# Patient Record
Sex: Female | Born: 1955 | Race: White | Hispanic: No | Marital: Married | Smoking: Former smoker
Health system: Southern US, Community
[De-identification: ages and names within clinical notes are randomized; demographics above are authoritative.]

## PROBLEM LIST (undated history)

## (undated) DIAGNOSIS — M797 Fibromyalgia: Secondary | ICD-10-CM

## (undated) DIAGNOSIS — I219 Acute myocardial infarction, unspecified: Secondary | ICD-10-CM

## (undated) DIAGNOSIS — F419 Anxiety disorder, unspecified: Secondary | ICD-10-CM

## (undated) DIAGNOSIS — K219 Gastro-esophageal reflux disease without esophagitis: Secondary | ICD-10-CM

## (undated) DIAGNOSIS — I2510 Atherosclerotic heart disease of native coronary artery without angina pectoris: Secondary | ICD-10-CM

## (undated) DIAGNOSIS — F329 Major depressive disorder, single episode, unspecified: Secondary | ICD-10-CM

## (undated) DIAGNOSIS — T8859XA Other complications of anesthesia, initial encounter: Secondary | ICD-10-CM

## (undated) DIAGNOSIS — E785 Hyperlipidemia, unspecified: Secondary | ICD-10-CM

## (undated) DIAGNOSIS — I499 Cardiac arrhythmia, unspecified: Secondary | ICD-10-CM

## (undated) DIAGNOSIS — F32A Depression, unspecified: Secondary | ICD-10-CM

## (undated) DIAGNOSIS — I10 Essential (primary) hypertension: Secondary | ICD-10-CM

## (undated) HISTORY — DX: Essential (primary) hypertension: I10

## (undated) HISTORY — PX: SPINE SURGERY: SHX786

## (undated) HISTORY — DX: Depression, unspecified: F32.A

## (undated) HISTORY — PX: CHOLECYSTECTOMY: SHX55

## (undated) HISTORY — PX: CORONARY STENT PLACEMENT: SHX1402

## (undated) HISTORY — DX: Major depressive disorder, single episode, unspecified: F32.9

## (undated) HISTORY — PX: ABDOMINAL HYSTERECTOMY: SHX81

## (undated) HISTORY — DX: Hyperlipidemia, unspecified: E78.5

---

## 1997-10-24 ENCOUNTER — Ambulatory Visit (HOSPITAL_COMMUNITY): Admission: RE | Admit: 1997-10-24 | Discharge: 1997-10-24 | Payer: Self-pay | Admitting: *Deleted

## 1997-12-03 ENCOUNTER — Other Ambulatory Visit: Admission: RE | Admit: 1997-12-03 | Discharge: 1997-12-03 | Payer: Self-pay | Admitting: Obstetrics & Gynecology

## 1999-02-19 ENCOUNTER — Encounter: Payer: Self-pay | Admitting: Obstetrics & Gynecology

## 1999-02-19 ENCOUNTER — Ambulatory Visit (HOSPITAL_COMMUNITY): Admission: RE | Admit: 1999-02-19 | Discharge: 1999-02-19 | Payer: Self-pay | Admitting: Obstetrics & Gynecology

## 1999-09-18 ENCOUNTER — Other Ambulatory Visit: Admission: RE | Admit: 1999-09-18 | Discharge: 1999-09-18 | Payer: Self-pay | Admitting: Obstetrics & Gynecology

## 1999-09-24 ENCOUNTER — Encounter: Payer: Self-pay | Admitting: Obstetrics & Gynecology

## 1999-09-24 ENCOUNTER — Encounter: Admission: RE | Admit: 1999-09-24 | Discharge: 1999-09-24 | Payer: Self-pay | Admitting: Obstetrics & Gynecology

## 2001-02-02 ENCOUNTER — Ambulatory Visit (HOSPITAL_COMMUNITY): Admission: RE | Admit: 2001-02-02 | Discharge: 2001-02-02 | Payer: Self-pay | Admitting: Obstetrics & Gynecology

## 2001-02-02 ENCOUNTER — Encounter: Payer: Self-pay | Admitting: Obstetrics & Gynecology

## 2003-04-06 ENCOUNTER — Ambulatory Visit (HOSPITAL_COMMUNITY): Admission: RE | Admit: 2003-04-06 | Discharge: 2003-04-06 | Payer: Self-pay | Admitting: Obstetrics & Gynecology

## 2003-10-17 ENCOUNTER — Observation Stay (HOSPITAL_COMMUNITY): Admission: EM | Admit: 2003-10-17 | Discharge: 2003-10-18 | Payer: Self-pay | Admitting: Emergency Medicine

## 2003-10-17 ENCOUNTER — Encounter (INDEPENDENT_AMBULATORY_CARE_PROVIDER_SITE_OTHER): Payer: Self-pay | Admitting: Specialist

## 2004-06-19 ENCOUNTER — Ambulatory Visit (HOSPITAL_COMMUNITY): Admission: RE | Admit: 2004-06-19 | Discharge: 2004-06-19 | Payer: Self-pay | Admitting: Obstetrics & Gynecology

## 2004-06-24 ENCOUNTER — Other Ambulatory Visit: Admission: RE | Admit: 2004-06-24 | Discharge: 2004-06-24 | Payer: Self-pay | Admitting: Obstetrics & Gynecology

## 2005-10-15 ENCOUNTER — Ambulatory Visit (HOSPITAL_COMMUNITY): Admission: RE | Admit: 2005-10-15 | Discharge: 2005-10-15 | Payer: Self-pay | Admitting: Obstetrics & Gynecology

## 2006-11-17 ENCOUNTER — Ambulatory Visit (HOSPITAL_COMMUNITY): Admission: RE | Admit: 2006-11-17 | Discharge: 2006-11-17 | Payer: Self-pay | Admitting: Obstetrics & Gynecology

## 2008-01-02 ENCOUNTER — Ambulatory Visit (HOSPITAL_COMMUNITY): Admission: RE | Admit: 2008-01-02 | Discharge: 2008-01-02 | Payer: Self-pay | Admitting: Obstetrics & Gynecology

## 2009-01-03 ENCOUNTER — Ambulatory Visit (HOSPITAL_COMMUNITY): Admission: RE | Admit: 2009-01-03 | Discharge: 2009-01-03 | Payer: Self-pay | Admitting: Family Medicine

## 2009-01-16 ENCOUNTER — Encounter: Admission: RE | Admit: 2009-01-16 | Discharge: 2009-01-16 | Payer: Self-pay | Admitting: Obstetrics & Gynecology

## 2009-07-02 ENCOUNTER — Emergency Department (HOSPITAL_COMMUNITY): Admission: EM | Admit: 2009-07-02 | Discharge: 2009-07-02 | Payer: Self-pay | Admitting: Emergency Medicine

## 2010-01-26 ENCOUNTER — Encounter: Payer: Self-pay | Admitting: Obstetrics & Gynecology

## 2010-01-26 ENCOUNTER — Encounter: Payer: Self-pay | Admitting: Family Medicine

## 2010-03-10 ENCOUNTER — Ambulatory Visit (HOSPITAL_COMMUNITY)
Admission: RE | Admit: 2010-03-10 | Discharge: 2010-03-10 | Disposition: A | Discharge status: Home / Self Care | Payer: Worker's Compensation | Source: Ambulatory Visit | Attending: Orthopedic Surgery | Admitting: Orthopedic Surgery

## 2010-03-10 ENCOUNTER — Encounter (HOSPITAL_COMMUNITY)
Admission: RE | Admit: 2010-03-10 | Discharge: 2010-03-10 | Disposition: A | Discharge status: Home / Self Care | Payer: Worker's Compensation | Source: Ambulatory Visit | Attending: Orthopedic Surgery | Admitting: Orthopedic Surgery

## 2010-03-10 ENCOUNTER — Other Ambulatory Visit (HOSPITAL_COMMUNITY): Payer: Self-pay | Admitting: Orthopedic Surgery

## 2010-03-10 DIAGNOSIS — M5137 Other intervertebral disc degeneration, lumbosacral region: Secondary | ICD-10-CM | POA: Insufficient documentation

## 2010-03-10 DIAGNOSIS — Z01812 Encounter for preprocedural laboratory examination: Secondary | ICD-10-CM | POA: Insufficient documentation

## 2010-03-10 DIAGNOSIS — M5126 Other intervertebral disc displacement, lumbar region: Secondary | ICD-10-CM

## 2010-03-10 DIAGNOSIS — M51379 Other intervertebral disc degeneration, lumbosacral region without mention of lumbar back pain or lower extremity pain: Secondary | ICD-10-CM | POA: Insufficient documentation

## 2010-03-10 DIAGNOSIS — Z01810 Encounter for preprocedural cardiovascular examination: Secondary | ICD-10-CM | POA: Insufficient documentation

## 2010-03-10 DIAGNOSIS — Z01818 Encounter for other preprocedural examination: Secondary | ICD-10-CM | POA: Insufficient documentation

## 2010-03-10 LAB — TYPE AND SCREEN
ABO/RH(D): A POS
Antibody Screen: NEGATIVE

## 2010-03-10 LAB — BASIC METABOLIC PANEL
BUN: 7 mg/dL (ref 6–23)
CO2: 33 mEq/L — ABNORMAL HIGH (ref 19–32)
Calcium: 9.7 mg/dL (ref 8.4–10.5)
GFR calc Af Amer: >60 mL/min (ref >60)
GFR calc non Af Amer: >60 mL/min (ref >60)
Glucose, Bld: 113 mg/dL — ABNORMAL HIGH (ref 70–99)

## 2010-03-10 LAB — CBC
Hemoglobin: 12.2 g/dL (ref 12.0–15.0)
MCH: 31.7 pg (ref 26.0–34.0)
MCHC: 34.2 g/dL (ref 30.0–36.0)
RBC: 3.85 MIL/uL — ABNORMAL LOW (ref 3.87–5.11)
WBC: 6.2 10*3/uL (ref 4.0–10.5)

## 2010-03-12 ENCOUNTER — Inpatient Hospital Stay (HOSPITAL_COMMUNITY)
Admission: RE | Admit: 2010-03-12 | Discharge: 2010-03-14 | DRG: 460 | Disposition: A | Discharge status: Home Health | Payer: Worker's Compensation | Source: Ambulatory Visit | Attending: Orthopedic Surgery | Admitting: Orthopedic Surgery

## 2010-03-12 ENCOUNTER — Inpatient Hospital Stay (HOSPITAL_COMMUNITY): Payer: Worker's Compensation

## 2010-03-12 DIAGNOSIS — M51379 Other intervertebral disc degeneration, lumbosacral region without mention of lumbar back pain or lower extremity pain: Principal | ICD-10-CM | POA: Diagnosis present

## 2010-03-12 DIAGNOSIS — K219 Gastro-esophageal reflux disease without esophagitis: Secondary | ICD-10-CM | POA: Diagnosis present

## 2010-03-12 DIAGNOSIS — M5137 Other intervertebral disc degeneration, lumbosacral region: Principal | ICD-10-CM | POA: Diagnosis present

## 2010-03-13 ENCOUNTER — Inpatient Hospital Stay (HOSPITAL_COMMUNITY): Payer: Worker's Compensation

## 2010-03-13 LAB — CBC
HCT: 27.4 % — ABNORMAL LOW (ref 36.0–46.0)
Hemoglobin: 9.2 g/dL — ABNORMAL LOW (ref 12.0–15.0)
MCH: 31.6 pg (ref 26.0–34.0)
MCHC: 33.6 g/dL (ref 30.0–36.0)
RDW: 11.9 % (ref 11.5–15.5)

## 2010-03-20 NOTE — Op Note (Signed)
[name], [name]               ACCOUNT NO.:  [id number]  MEDICAL RECORD NO.:  [id number]           PATIENT TYPE:  I  LOCATION:  5019                         FACILITY:  [facility]  PHYSICIAN:  Alvy Beal, MD    DATE OF BIRTH:  04/02/1955  DATE OF PROCEDURE:  03/12/2010 DATE OF DISCHARGE:                              OPERATIVE REPORT   PREOPERATIVE DIAGNOSIS:  Degenerative lumbar disk disease with radiculopathy.  POSTOPERATIVE DIAGNOSIS:  Degenerative lumbar disk disease with radiculopathy.  OPERATIVE PROCEDURE: 1. Gill decompression with exploration of the L5 and S1 nerve root on     the left side, L5-S1. 2. Complete diskectomy, L5-S1 with insertion of intervertebral     biomechanical device (a Titan, size 9, large titanium     intervertebral spacer). 3. Posterolateral arthrodesis with local autograft bone, L5-S1. 4. Segmental pedicle screw fusion/instrumentation, L5-S1 with the     Synthes Matrix pedicle screws.  COMPLICATIONS:  None.  CONDITION:  Stable.  Intraoperative neuro stim monitoring remained within normal limits throughout the course of the case.  ESTIMATED BLOOD LOSS:  300 mL.  HISTORY:  This is a very pleasant 55 year old woman who has been having progressive debilitating back, buttock, and increasing left leg pain. Because of the increasing left leg pain, the decision was made to do a TLIF in order to direct decompression of the nerve and fixation and fusion of the degenerated disk.  All appropriate risks, benefits, and alternatives to surgery were discussed with the patient and consent was obtained.  OPERATIVE NOTE:  The patient was brought to the operating room, placed in supine on the operating table.  After successful induction of general anesthesia endotracheal intubation, TED, SCDs were applied.  All appropriate lower extremity neuro monitoring devices were attached by the neuro stim representative.  The back was then prepped and draped in a  standard fashion.  Appropriate time-out was done to confirm patient, procedure, and all other pertinent important data.  Once this was completed, I then started on the right-hand side.  I identified, using fluoroscopy, the lateral portion of the L5 and S1 pedicles.  I then made an incision approximately 1 inch in length and then dissected through the deep lumbar paravertebral muscles down to the lateral aspect of the facet complex.  I then placed a Jamshidi needle on the lateral aspect on the transverse process and palpated up towards the medial aspect of the leg where the transition to the lateral aspect of the facet. I confirmed satisfactory position in the AP plane.  I then connected the Jamshidi needle to the neuro stim and then advanced it through the pedicle.  On the AP plane, once my Jamshidi needle was close to the medial wall of the pedicle, I then switched to the lateral.  This confirmed that I was just at the pedicle vertebral body junction.  Once I had confirmed trajectory in both planes as well as no electrical evidence of breach, I then advanced into the pedicle.  I repeated this procedure on the contralateral side and at S1.  Once all the pedicles were cannulated with the Jamshidi needle, I  advanced guide pin through the Jamshidi needle.  On the left-hand side as this was going to be the TLIF side, I then tapped over each guide pin and placed a 35 mm 6-0 pedicle screw at S1 and a 40-mm 6-0 pedicle screw at L5.  I then stimulated both screws and I had satisfactory position and placement.  At this point, I then placed my MIS retractor in the left-sided wound, appropriately positioned it, and attached to the leg, and then proceeded with the decompression.  Using an osteotome, I performed a complete inferior facetectomy of L5. I then developed a plane underneath the L5 lamina and resected the majority of the L5 lamina.  I then excised the ligamentum flavum and exposed the  thecal sac.  At this point, I could visualize the thecal sac and the traversing S1 nerve root.  I then protected these structures and then continued my decompression by performing a facetectomy, removing the bone spur from the S1 superior facet until I could visualize the medial border of the S1 pedicle.  I then carried my dissection into the lateral recess, removing the remainder majority of the S1 facet that had overgrown.  I also resected the entire pars.  At this point, I could now see the L5 nerve root completely unroofed and the neural foramen.  I then carried my dissection superiorly until I could see the L5 inferior pedicle border, inferior aspect of pedicle.  At this point, with a [facility], I was able to easily palpate superiorly in the lateral recess along the medial border of the L5 pedicle and inferiorly along the medial border of the S1 pedicle.  With the decompression complete, with the Gill decompression complete, I then proceeded with the diskectomy.  Self-retaining nerve root retractor was placed to protect the thecal sac.  I incised the disk space and then using a series of pituitary rongeurs, curettes, and Kerrison rongeurs, and endplate scrapers, I removed all the L5-S1 disk.  Once I had an adequate diskectomy, I then used trial size 8 and intervertebral TLIF graft which got good purchase.  At this point with a good purchase of the vein, I elected to use a 9 long Titan intervertebral graft using a local bone that I had harvested mixed with cortical cancellous bone chips mixed with DBX, I packed the cage.  I made sure that the exiting and traversing nerve roots were well protected and the thecal sac was protected.  I then malleted the cage to the appropriate depth and then made sure that it went from a vertical to horizontal position.  At this point, with the intervertebral graft fusion complete, instrumentation complete, I then proceeded with the  instrumentation.  I was somewhat concerned with the L5 and the S1 screws as I did get a response at 8 mA although this is still in the safe zone, I removed the L5 pedicle screw and used a pedicle probe to palpate down the hole.  I realized that there was no breach.  In addition, I could visualize the medial and inferior aspect of the pedicle and there was no evidence of breach.  At this point, I tapped with a 6-0 tap and placed a 7-0 pedicle screw which got excellent purchase.  Again, with direct visualization of the pedicle, there was no evidence of any breach and with neuro monitoring, I got a satisfactory reading.  I also felt that the 35 screw at S1 was too long, so this was replaced with a  30-mm 7-0 diameter screw.  Again, I rechecked the screws.  With the neuro monitoring, there was no evidence electrodiagnostically of breach and under direct visualization, there was no evidence of breach.  At this point, I irrigated copiously this wound, checked again to make sure the graft was well seated, and then measured the space.  I placed a 45-mm long rod and then torqued the locking nut down in appropriate fashion.  I irrigated this wound again, placed a thrombin-soaked Gelfoam patty over the exposed thecal sac, closed the deep fascia with interrupted 0 Vicryl suture, superficial with 2-0 Vicryl suture, and 3-0 Monocryl for the skin.  I then turned my attention to the right-hand side.  I then exposed the L5-S1 facet complex and using an osteotome, completely disrupted this joint.  I then packed in the posterior lateral gutter the remaining portion and then along the facet the remaining portion of bone graft.  I then tapped over the guide pin and then placed a 30 mm 6-0 screw at S1 and a 40 mm 6-0 screw at L5.  Both screws had excellent purchase and when stimulated, there was no evidence of breach.  I then used the same size rod, tightened down the nut, and removed the inserting devices.  At  this point, I took final intraoperative x-rays which showed satisfactory position of intervertebral graft and the pedicle screws.  I closed the right-sided wound in a similar fashion as the left, put Steri-Strips and dry dressing.  The patient was extubated, transferred to the PACU without incident.  At the end of the case, all needle and sponge counts were correct.  The patient tolerated the procedure well.  She was transferred to the PACU without incident.     Alvy Beal, MD     DDB/MEDQ  D:  03/12/2010  T:  03/13/2010  Job:  [id number]  cc:   [facility]  Electronically Signed by Venita Lick MD on 03/20/2010 11:47:15 AM

## 2010-04-08 NOTE — Discharge Summary (Signed)
  [name], [name]               ACCOUNT NO.:  [id number]  MEDICAL RECORD NO.:  [id number]           PATIENT TYPE:  I  LOCATION:  5019                         FACILITY:  [facility]  PHYSICIAN:  Alvy Beal, MD    DATE OF BIRTH:  11-06-1955  DATE OF ADMISSION:  03/12/2010 DATE OF DISCHARGE:  03/14/2010                              DISCHARGE SUMMARY   ADMITTING DIAGNOSIS:  Degenerative lumbar disk disease with radiculopathy.  HISTORY:  This is a very pleasant 55 year old woman who has been having progressive debilitating back, buttock and left leg pain for sometime now.  Because of the inability to improve with conservative management consisting of physical therapy, injection therapy, medications, decision was made to take her to the operating room for a surgical procedure. The patient had extensive medical problems and so preoperative medical clearance was obtained by the cardiologist.  I have included my office charts with specifics on her past medical, surgical, family and social history, please refer to it for specifics.  Because of the failure of conservative management to alleviate her symptoms, we decided to proceed with surgery.  Since she was having significant radicular leg pain as well as degenerative disk disease and back pain, I elected to proceed with a TLIF.  On the day of admission, she was brought to the operating room for the TLIF procedure.  Surgery was uneventful and she was doing well.  On postoperative day #1 per her cardiologist recommendations, her troponin value was obtained, which was less than 0.01.  The EKG did show changes.  My initial note indicated that there was no acute changes, but this was her preoperative EKG.  Her postoperative EKG did demonstrate questionable ischemic changes.  I spoke directly with Dr. Dulce Sellar, her cardiologist concerning this.  I faxed him the EKG report and he contact me back and he did not feel as though the patient was  having ischemic changes.  Her troponins were negative and she was not complaining of chest pain.  As a result on postoperative day #2, the patient was ultimately discharged to home. She had appropriate followup with her cardiologist.  She was ambulating, voiding spontaneously and her postoperative x-rays were satisfactory. The patient's pain was well controlled with oral medications.  At this point, she will be discharged to home with preprinted instructions for her spine, follow up with her cardiologist Dr. Dulce Sellar and follow up with me in 2 weeks for wound evaluation.  The patient was aware of the changes to her EKG, she was in agreement with the plan.     Alvy Beal, MD     DDB/MEDQ  D:  03/31/2010  T:  04/01/2010  Job:  [id number]  Electronically Signed by Venita Lick MD on 04/08/2010 08:42:04 AM

## 2010-05-23 NOTE — Op Note (Signed)
[name], [name]               ACCOUNT NO.:  [id number]   MEDICAL RECORD NO.:  [id number]          PATIENT TYPE:  OBV   LOCATION:  0474                         FACILITY:  [facility]   PHYSICIAN:  Lorre Munroe., M.D.DATE OF BIRTH:  12-23-1955   DATE OF PROCEDURE:  10/17/2003  DATE OF DISCHARGE:                                 OPERATIVE REPORT   PREOPERATIVE DIAGNOSIS:  Cholelithiasis and acute cholecystitis.   POSTOPERATIVE DIAGNOSIS:  Cholelithiasis and acute cholecystitis.   OPERATION:  Laparoscopic cholecystectomy.   SURGEON:  Lebron Conners, M.D.   ANESTHESIA:  General.   PROCEDURE:  After the patient was monitored and anesthetized and had routine  preparation and draping of the abdomen, I infiltrated a local anesthetic  just below the umbilicus and made a short transverse incision.  I split the  fascia longitudinally then entered the peritoneum bluntly.  After placing a  0 Vicryl purse-string suture and securing a Hasson cannula, I inflated the  abdomen with CO2 and examined the contents.  The gas which escaped,  instituting the pneumoperitoneum, had a somewhat foul odor.  I thoroughly  examined the abdominal contents and saw a distended gallbladder, which was  inflamed in appearance.  It had some greenish cast, as if it might be  partially gangrenous; however, I was not sure that it was the only source of  inflammation because she had a high fever.  I saw the appendix, and it  looked normal.  The colon appeared normal.  I saw no evidence of a  perforated ulcer or any other bowel abnormality.  Decided that the only  abnormality was acute cholecystitis, so I anesthetized three additional  areas and placed a 10 mm epigastric and two 5 mm right mid abdominal ports.  Retracting the fundus of the gallbladder towards the right shoulder, I took  down adhesions of omentum to its under-surface and then retracted the  infundibulum laterally.  I dissected out the cystic duct and  the cystic  artery, clearly identifying each structure, noting the emergence of the  cystic duct from the infundibulum and noting its entry into the common bile  duct.  I clipped the cystic duct with four clips and cut between the two  closest to the gallbladder.  I clipped the cystic artery with three clips  and cut between the two closest to the gallbladder.  I then dissected the  gallbladder from the liver using the spatula dissector with cautery.  I got  hemostasis with the cautery.  I accidentally punctured the gallbladder at  one point and spilled some bile but no stones.  I sucked away the bile, then  completed the removal of the gallbladder and placed it in a plastic pouch.  I then copiously irrigated the operative area and removed the irrigant.  I  then checked for hemostasis and found it to be good.  I removed the  gallbladder through the umbilical incision and  tied the purse-string suture, then removed the lateral ports under direct  vision and then allowed the CO2 to escape and removed the epigastric port.  I closed  all skin incisions with intracuticular 4-0 Vicryl and Steri-Strips.  The patient tolerated the operation well.      WB/MEDQ  D:  10/17/2003  T:  10/17/2003  Job:  [id number]   cc:   Windle Guard, M.D.  [address]  [place], [place] [address]  Fax: [phone]

## 2010-05-23 NOTE — H&P (Signed)
[name], [name]               ACCOUNT NO.:  [id number]   MEDICAL RECORD NO.:  [id number]          PATIENT TYPE:  OBV   LOCATION:  0474                         FACILITY:  [facility]   PHYSICIAN:  Lorre Munroe., M.D.DATE OF BIRTH:  07/04/1955   DATE OF ADMISSION:  10/17/2003  DATE OF DISCHARGE:                                HISTORY & PHYSICAL   CHIEF COMPLAINT:  Abdominal pain.   HISTORY OF PRESENT ILLNESS:  This is a previously healthy 55 year old white  female who has about a two-day history of upper abdominal pain and fever.  The pain had become quite severe today.  She was seen by Dr. Jeannetta Nap and  found to have abdominal tenderness and a temperature of 102.  She was  referred to me for care.  Gallbladder ultrasound was done in the emergency  department and showed cholelithiasis and thickening of the gallbladder,  consistent with acute cholecystitis.  White count was 7800.  Patient is  admitted to the hospital for a cholecystectomy.  She has not noticed any  dark urine or light stools or jaundice.   PAST MEDICAL HISTORY:  She has had a vaginal hysterectomy and suffers from  anxiety.  She takes Xanax from time to time.  Codeine makes her nauseated  and also gives her a rash.  She denies all serious chronic ailments.  She  denied chest pain, shortness of breath, back pain, or other problems on  review of systems.   FAMILY HISTORY:  Unremarkable.   CHILDHOOD ILLNESSES:  Unremarkable.   PHYSICAL EXAMINATION:  VITAL SIGNS:  Blood pressure 141/90, temperature  100.9, heart rate 113.  GENERAL:  Patient is in pain.  Mental status normal.  HEENT:  Unremarkable.  NECK:  Unremarkable with no thyromegaly or lymphadenopathy.  LUNGS:  Clear to auscultation.  HEART:  Rate and rhythm normal.  No murmur or gallop.  ABDOMEN:  Tender along the right side, both in the right upper quadrant and  in the right lower quadrant.  Minimal rebound tenderness.  No spasm of the  muscles.  No masses  noted.  Bowel sounds are decreased.  RECTAL/PELVIC:  Not performed.  EXTREMITIES:  No edema.  No deformities.  Pulses full.  NEUROLOGIC:  Grossly normal.  SKIN:  No lesions are noted.  Lymph nodes are not enlarged.   IMPRESSION:  Acute cholecystitis and cholelithiasis.   PLAN:  Immediate laparoscopic cholecystectomy.    WB/MEDQ  D:  10/17/2003  T:  10/17/2003  Job:  [id number]

## 2010-06-25 ENCOUNTER — Other Ambulatory Visit (HOSPITAL_COMMUNITY): Payer: Self-pay | Admitting: Obstetrics and Gynecology

## 2010-06-25 DIAGNOSIS — Z1231 Encounter for screening mammogram for malignant neoplasm of breast: Secondary | ICD-10-CM

## 2010-07-01 ENCOUNTER — Ambulatory Visit (HOSPITAL_COMMUNITY): Payer: Worker's Compensation

## 2010-07-08 ENCOUNTER — Ambulatory Visit (HOSPITAL_COMMUNITY)
Admission: RE | Admit: 2010-07-08 | Discharge: 2010-07-08 | Disposition: A | Discharge status: Home / Self Care | Payer: Worker's Compensation | Source: Ambulatory Visit | Attending: Obstetrics and Gynecology | Admitting: Obstetrics and Gynecology

## 2010-07-08 DIAGNOSIS — Z1231 Encounter for screening mammogram for malignant neoplasm of breast: Secondary | ICD-10-CM

## 2011-09-02 ENCOUNTER — Encounter: Payer: Self-pay | Admitting: Physical Medicine & Rehabilitation

## 2011-09-18 ENCOUNTER — Encounter
Payer: Worker's Compensation | Attending: Physical Medicine & Rehabilitation | Admitting: Physical Medicine & Rehabilitation

## 2011-09-18 ENCOUNTER — Encounter: Payer: Self-pay | Admitting: Physical Medicine & Rehabilitation

## 2011-09-18 ENCOUNTER — Other Ambulatory Visit: Payer: Self-pay | Admitting: Physical Medicine and Rehabilitation

## 2011-09-18 VITALS — BP 145/85 | HR 63 | Resp 16 | Ht 62.0 in | Wt 125.0 lb

## 2011-09-18 DIAGNOSIS — F341 Dysthymic disorder: Secondary | ICD-10-CM | POA: Insufficient documentation

## 2011-09-18 DIAGNOSIS — G8929 Other chronic pain: Secondary | ICD-10-CM | POA: Insufficient documentation

## 2011-09-18 DIAGNOSIS — IMO0002 Reserved for concepts with insufficient information to code with codable children: Secondary | ICD-10-CM | POA: Insufficient documentation

## 2011-09-18 DIAGNOSIS — M961 Postlaminectomy syndrome, not elsewhere classified: Secondary | ICD-10-CM | POA: Insufficient documentation

## 2011-09-18 DIAGNOSIS — F329 Major depressive disorder, single episode, unspecified: Secondary | ICD-10-CM

## 2011-09-18 MED ORDER — PREGABALIN 50 MG PO CAPS: 50.0000 mg | ORAL_CAPSULE | Freq: Three times a day (TID) | ORAL | Status: DC

## 2011-09-18 MED ORDER — NORTRIPTYLINE HCL 10 MG PO CAPS: 10.0000 mg | ORAL_CAPSULE | Freq: Every day | ORAL | Status: DC

## 2011-09-18 NOTE — Patient Instructions (Signed)
CALL ME WITH PROBLEMS OR QUESTIONS 

## 2011-09-18 NOTE — Progress Notes (Signed)
Subjective:    Patient ID: [name], female    DOB: October 08, 1955, 56 y.o.   MRN: [id number]  HPI  This is the initial evaluation for [name] who is here regarding chronic back and leg pain. She first injured her back on 03/25/09 when she was at work and experienced sudden onset of back and left leg pain while she was twisting with and lifting a box. MRI of the lumbar spine revealed L5-S1 disc broad based disc bulge, facet arthropathy, and bi-foraminal stenosis. She had a left S1 nerve root block by Dr. Ethelene Hal in June of 2011 which did not help. After the injection she appears to have coincidentally developed symptomatic coronary artery stenosis and had a stent placed. Apparently a year later she had further cardiac issues and ultimately had an angioplasty performed in the same vessel.    After failing conservative management, and because her symptoms were not improving, [name] ultimately opted for an L5-S1 posterior fusion and left hemi-laminectomy, bilateral facectomy in March 2012 by Dr. Venita Roach. She went through a course of physical therapy post-operatively for a period of 2 or 3 months. The therapy ([facility]) addressed ROM and strength in her back and legs. She feels that she would have benefited from further therapies.    From a pharmaceutical standpoint, she has tried oxycodone IR 10mg , oxycontin, lyrica, and neurontin. Her cardiologist ultimately stopped the lyrica because of indigestion/reflex. She was placed on a PPI at that time and hasn't had any symptoms since.. As an alternative to the lyrica, she was placed on neurontin (300mg  tid) which didn't help nearly as much. She has tried hydrocodone also.    She had at least one CT last year which showed good alignment of hardware and a stable spine.  She has intermittent numbness in the entire leg if she sits or stands for periods of time. She also has cramping in the left leg more than the right. Sleep has become more an more  of a problem.  [name] complains of increased depression and anxiety as a result of her pain symptoms. She was placed on pristiq about 2 months. She reports a 13 lb weigh gain over the last couple years which is quite a disappointment for her, as she was someone who had previoulsy prided herself in the fact that she stayed active and fit. She currently has difficulty with such things as tieing her shoes, washing herself, performing work around the house, etc.    Pain Inventory Average Pain 8 Pain Right Now 9 My pain is burning, dull, tingling and aching It causes her a lot of difficulties when she is lying in supine, sleeping at night as well as the below.   In the last 24 hours, has pain interfered with the following? General activity 5 Relation with others 5 Enjoyment of life 5 What TIME of day is your pain at its worst? morning Sleep (in general) Poor  Pain is worse with: walking, sitting, standing and some activites Pain improves with: rest, heat/ice and medication Relief from Meds: 8  Mobility walk without assistance how many minutes can you walk? 30 do you drive?  yes transfers alone  Function employed # of hrs/week not working at this time  Neuro/Psych weakness trouble walking depression anxiety  Prior Studies x-rays CT/MRI  Physicians involved in your care Orthopedist Dr Shon Baton   Family History  Problem Relation Age of Onset  . Heart disease Mother   . Diabetes Mother   .  Heart disease Brother    History   Social History  . Marital Status: Married    Spouse Name: N/A    Number of Children: N/A  . Years of Education: N/A   Social History Main Topics  . Smoking status: Former Games developer  . Smokeless tobacco: None  . Alcohol Use: None  . Drug Use: None  . Sexually Active: None   Other Topics Concern  . None   Social History Narrative  . None   Past Surgical History  Procedure Date  . Spine surgery   . Abdominal hysterectomy    Past  Medical History  Diagnosis Date  . Hypertension   . Hyperlipidemia   . Depression    BP 145/85  Pulse 63  Resp 16  Ht 5\' 2"  (1.575 m)  Wt 125 lb (56.7 kg)  BMI 22.86 kg/m2  SpO2 98%      Review of Systems  Constitutional: Positive for diaphoresis and unexpected weight change.  HENT: Negative.   Eyes: Negative.   Respiratory: Negative.   Cardiovascular: Negative.   Gastrointestinal: Positive for nausea and constipation.  Genitourinary: Negative.   Musculoskeletal: Positive for back pain and gait problem.  Skin: Negative.   Neurological: Positive for weakness.  Hematological: Bruises/bleeds easily.  Psychiatric/Behavioral:       Depression/Anxiety       Objective:   Physical Exam   General: Alert and oriented x 3, No apparent distress HEENT: Head is normocephalic, atraumatic, PERRLA, EOMI, sclera anicteric, oral mucosa pink and moist, dentition intact, ext ear canals clear,  Neck: Supple without JVD or lymphadenopathy Heart: Reg rate and rhythm. No murmurs rubs or gallops Chest: CTA bilaterally without wheezes, rales, or rhonchi; no distress Abdomen: Soft, non-tender, non-distended, bowel sounds positive. Extremities: No clubbing, cyanosis, or edema. Pulses are 2+ Skin: Clean and intact without signs of breakdown Neuro: Pt is cognitively appropriate with normal insight, memory, and awareness. Cranial nerves 2-12 are intact. Sensory exam is normal except for the dorsum and sole of the left foot. Reflexes are 2+ in all 4's.(even the bilateral achilles) Fine motor coordination is intact. No tremors. Motor function is grossly 5/5 in the upper extremities. RLE is grossly 4-5/5 with pain inhibition proximally. LLE is 3-4 prox (pain), 4/5 with KE and KF, 3-4 at the ankle with APF and ADF but she was not very consistent with effort however. She walked with a slightly wide based gait. She seemed to have some difficulty clearing the left leg in swing phase. Musculoskeletal: SLR  was positive on the left, equivocal on the right. Seated slump test was positive on the left. Left hamstrings, gastroc, and paraspinals were all tight with PROM. She had mild pain with palpation over the lumbar paraspinals. PSIS's and greater trochs were minimally tender.  Psych: Pt's affect is generally appropriate although she is a bit flat. Pt is cooperative. I saw no signs of symptom magnification.        Assessment & Plan:  1. Lumbar post laminectomy syndrome 2. Chronic left L5 and S1 Radiculitis 3. Reactive Depression   Plan:  1. I reviewed the treatment plan with the patient at length. More than anything she has to regain range of motion in her low back and lower extremities (particularly the left). To do that we have to decrease her pain levels. The positive thing for [name] is that her motor and sensory function is pretty well intact in the left leg, so she should respond to appropriate treatment. 2. Initially,  because she had success with lyrica and she's currently on a PPI, we will re-try lyica starting at 50mg  qhs and titrating to TID over one week. 3. Additionally, for sleep and neuro pain, I will initiate pamelor 10-20mg . She will begin this in about one week. She may stay on the pristiq which is a good choice given her pain source. 4. I made a referral to physical therapy to work on lumbar, pelvic, and lower ext ROM, strengthengin, lengthening as well as desensitization exercises for her lumbar-sacral nerver roots. 5. Will consider adding a narcotic based on the response to the above. 6. A pain-psychology referral may be helpful, but based on my findings today, I would like to address her specific ortho/neuro issues first to see how she responds. Although this has become a chronic issue, and she is having depression, I feel that if we find a way to bring her some relief her general picture will improve as well. 7. I spent over an hour with this patient reviewing her history and  coming up with a custom treatment plan. All questions were encouraged and answered 8. I will see her back in about a month.

## 2011-09-28 ENCOUNTER — Telehealth: Payer: Self-pay | Admitting: Physical Medicine & Rehabilitation

## 2011-09-28 NOTE — Telephone Encounter (Signed)
Needs to talk to RN after 11:30.

## 2011-09-28 NOTE — Telephone Encounter (Signed)
Returning call.

## 2011-09-28 NOTE — Telephone Encounter (Signed)
LM for pt to call back.

## 2011-09-29 MED ORDER — PREGABALIN 100 MG PO CAPS: 100.0000 mg | ORAL_CAPSULE | Freq: Three times a day (TID) | ORAL | Status: DC

## 2011-09-29 NOTE — Telephone Encounter (Signed)
Just was seen about 11 days ago for this chronic problem!   If she's tolerating lyrica, i would increase as follows:  (she's currenlty on 50mg  tid)  50-50-100 for 3 days.. Then 100-50-100 for 3 days, then 100mg  tid  We can call in a 100mg  tid rx if she would like #90, 3 rf

## 2011-09-29 NOTE — Telephone Encounter (Signed)
Pt aware of increase.

## 2011-09-29 NOTE — Telephone Encounter (Signed)
Started taking Lyrica and anti-depressant. Still having increased pain in her back, feet and legs. Has recently started therapy. She is wanting to know if there is anything else Dr. Riley Kill can do.  Please advise.

## 2011-10-07 ENCOUNTER — Telehealth: Payer: Self-pay | Admitting: Physical Medicine & Rehabilitation

## 2011-10-07 NOTE — Telephone Encounter (Signed)
Having issues with severe HA's and swelling.  Please call.

## 2011-10-08 NOTE — Telephone Encounter (Signed)
Having bad migraine headaches. Swelling is mainly from her knees down. BP has been going up and down. Has taken ibuprofen for her headaches. Thinks all of this is coming from increasing Lyrica. She has stopped taking this.  Please advise.

## 2011-10-09 MED ORDER — TOPIRAMATE 25 MG PO TABS: 25.0000 mg | ORAL_TABLET | Freq: Two times a day (BID) | ORAL | Status: DC

## 2011-10-09 NOTE — Addendum Note (Signed)
Addended by: Caryl Ada on: 10/09/2011 02:01 PM   Modules accepted: Orders

## 2011-10-09 NOTE — Telephone Encounter (Signed)
Swelling could be from lyrica. Headaches perhaps could be as well. Can begin a trial of topamax 25mg  qhs for 4 days then 50mg  qhs thereafter. #60, 2RF

## 2011-10-13 ENCOUNTER — Telehealth: Payer: Self-pay | Admitting: *Deleted

## 2011-10-13 NOTE — Telephone Encounter (Signed)
Medication isn't working. Wants a call back.

## 2011-10-14 NOTE — Telephone Encounter (Signed)
Lm on both numbers for patient to schedule appointment to be re-evaluated.

## 2011-10-16 ENCOUNTER — Encounter: Payer: Self-pay | Admitting: Physical Medicine & Rehabilitation

## 2011-10-16 ENCOUNTER — Telehealth: Payer: Self-pay | Admitting: Physical Medicine & Rehabilitation

## 2011-10-16 ENCOUNTER — Encounter
Payer: Worker's Compensation | Attending: Physical Medicine & Rehabilitation | Admitting: Physical Medicine & Rehabilitation

## 2011-10-16 VITALS — BP 132/84 | HR 69 | Resp 14 | Ht 62.0 in | Wt 125.6 lb

## 2011-10-16 DIAGNOSIS — F329 Major depressive disorder, single episode, unspecified: Secondary | ICD-10-CM

## 2011-10-16 DIAGNOSIS — M961 Postlaminectomy syndrome, not elsewhere classified: Secondary | ICD-10-CM | POA: Insufficient documentation

## 2011-10-16 DIAGNOSIS — M461 Sacroiliitis, not elsewhere classified: Secondary | ICD-10-CM | POA: Insufficient documentation

## 2011-10-16 DIAGNOSIS — IMO0002 Reserved for concepts with insufficient information to code with codable children: Secondary | ICD-10-CM | POA: Insufficient documentation

## 2011-10-16 MED ORDER — OXYCODONE HCL 10 MG PO TABS: 10.0000 mg | ORAL_TABLET | Freq: Four times a day (QID) | ORAL | Status: DC | PRN

## 2011-10-16 MED ORDER — NORTRIPTYLINE HCL 25 MG PO CAPS: 25.0000 mg | ORAL_CAPSULE | Freq: Every day | ORAL | Status: DC

## 2011-10-16 NOTE — Telephone Encounter (Signed)
Patient is on new blood thinner (did not state name), states that today she has just doubled up on aspirin.  She is to be scheduled for injection, but needs to know what to do and when before injection.  Please advise.

## 2011-10-16 NOTE — Patient Instructions (Signed)
CALL ME WITH ANY PROBLEMS

## 2011-10-16 NOTE — Progress Notes (Signed)
Subjective:    Patient ID: [name], female    DOB: June 21, 1955, 56 y.o.   MRN: [id number]  HPI  Mrs. Nordquist is back regarding her back pain. She did not tolerate the lyrica or topamax which caused headaches and nausea and ultimately INCREASED her pain. She is quite depressed that her pain is persistent, and she is not "seeing a light at the end of the tunnel."   She stopped the pamelor also, mostly because she doesn't feel that it has helped.  She has been working with PT who also note that her pain is not improving. They are addressing ROM, modalities, soft tissue desensitization, etc. She seems satisfied with their efforts to this point although they have not been successful.  Pain Inventory Average Pain 9 Pain Right Now 10 My pain is sharp, burning, stabbing, tingling and aching  In the last 24 hours, has pain interfered with the following? General activity 10 Relation with others 10 Enjoyment of life 10 What TIME of day is your pain at its worst? all of the time Sleep (in general) Poor  Pain is worse with: walking, bending, standing and some activites Pain improves with: nothing Relief from Meds: 0  Mobility walk without assistance how many minutes can you walk? maybe 30 ability to climb steps?  yes do you drive?  yes  Function not employed: date last employed  I need assistance with the following:  meal prep, household duties and shopping  Neuro/Psych weakness tingling spasms depression anxiety  Prior Studies Any changes since last visit?  no  Physicians involved in your care Any changes since last visit?  no   Family History  Problem Relation Age of Onset  . Heart disease Mother   . Diabetes Mother   . Heart disease Brother    History   Social History  . Marital Status: Married    Spouse Name: N/A    Number of Children: N/A  . Years of Education: N/A   Social History Main Topics  . Smoking status: Former Games developer  . Smokeless tobacco:  Never Used  . Alcohol Use: None  . Drug Use: None  . Sexually Active: None   Other Topics Concern  . None   Social History Narrative  . None   Past Surgical History  Procedure Date  . Spine surgery   . Abdominal hysterectomy    Past Medical History  Diagnosis Date  . Hypertension   . Hyperlipidemia   . Depression    BP 132/84  Pulse 69  Resp 14  Ht 5\' 2"  (1.575 m)  Wt 125 lb 9.6 oz (56.972 kg)  BMI 22.97 kg/m2  SpO2 98%    Review of Systems  Constitutional: Positive for diaphoresis.  Respiratory: Positive for shortness of breath.   Gastrointestinal: Positive for nausea and abdominal pain.  Musculoskeletal: Positive for back pain and gait problem.       Spasms  Neurological: Positive for weakness.       Tingling  Psychiatric/Behavioral: Positive for dysphoric mood. The patient is nervous/anxious.   All other systems reviewed and are negative.       Objective:   Physical Exam  General: Alert and oriented x 3, No apparent distress  HEENT: Head is normocephalic, atraumatic, PERRLA, EOMI, sclera anicteric, oral mucosa pink and moist, dentition intact, ext ear canals clear,  Neck: Supple without JVD or lymphadenopathy  Heart: Reg rate and rhythm. No murmurs rubs or gallops  Chest: CTA bilaterally without wheezes, rales,  or rhonchi; no distress  Abdomen: Soft, non-tender, non-distended, bowel sounds positive.  Extremities: No clubbing, cyanosis, or edema. Pulses are 2+  Skin: Clean and intact without signs of breakdown  Neuro: Pt is cognitively appropriate with normal insight, memory, and awareness. Cranial nerves 2-12 are intact. Sensory exam is normal except for the dorsum and sole of the left foot. Reflexes are 2+ in all 4's.(even the bilateral achilles) Fine motor coordination is intact. No tremors. Motor function is grossly 5/5 in the upper extremities. RLE is grossly 4-5/5 with pain inhibition proximally. LLE is 3-4 prox (pain), 4/5 with KE and KF, 3-4 at the  ankle with APF and ADF but she was not very consistent with effort however. She walked with a slightly wide based gait. She seemed to have some difficulty clearing the left leg in swing phase.  Musculoskeletal: SLR was positive on the left, equivocal on the right. Seated slump test was positive on the left. Left hamstrings, gastroc, and paraspinals were all tight with PROM. She had mild pain with palpation over the lumbar paraspinals. PSIS's were very tender. FABER test was positive bilaterally. Compression test was equivocal. Psych: Pt's affect is flat and she was tearful at times.  Pt is cooperative. I saw no signs of symptom magnification.   Assessment & Plan:   1. Lumbar post laminectomy syndrome  2. Chronic left L5 and S1 Radiculitis  3. Reactive Depression  4. Bilateral sacroiliitis   Plan:  1.Will arrange bilateral SI joint injections with Dr. Wynn Banker. 2. Will initiate oxycodone IR 10mg  to help reduce pain levels. She has had success with this in the past, and this point we need to decrease her pain as it's not only affecting her physically but emotionally. 3. Increase pamelor to 25 - 50 mg qhs for sleep and neuropathic pain.  4. Continue with PT with attention now to the SI joints in addition to what they have already been doing..  6. A pain-psychology referral still may be helpful, depending on her success with the treatment regimen.  7.  All questions were encouraged and answered

## 2011-10-19 NOTE — Telephone Encounter (Signed)
LM for pt to call back.

## 2011-10-19 NOTE — Telephone Encounter (Signed)
Pt aware that she will need to hold her ASA a day before her procedure just to be on the safe side.

## 2011-10-22 ENCOUNTER — Encounter: Payer: Self-pay | Admitting: Physical Medicine & Rehabilitation

## 2011-10-22 ENCOUNTER — Ambulatory Visit (HOSPITAL_BASED_OUTPATIENT_CLINIC_OR_DEPARTMENT_OTHER): Payer: Worker's Compensation | Admitting: Physical Medicine & Rehabilitation

## 2011-10-22 VITALS — BP 109/80 | HR 75 | Resp 14 | Ht 62.0 in | Wt 126.0 lb

## 2011-10-22 DIAGNOSIS — M461 Sacroiliitis, not elsewhere classified: Secondary | ICD-10-CM

## 2011-10-22 NOTE — Progress Notes (Signed)
  PROCEDURE RECORD [facility]   Name: [name] DOB:[birth year] MRN: [id number]  Date:10/22/2011  Physician: Claudette Laws, MD    Nurse/CMA: Yaviel Kloster/Carroll  Allergies:  Allergies  Allergen Reactions  . Codeine   . Lyrica (Pregabalin)   . Pravastatin   . Topamax (Topiramate)     Consent Signed: yes  Is patient diabetic? no   Pregnant: no LMP: No LMP recorded. Patient has had a hysterectomy. (age [age])  Anticoagulants: no Anti-inflammatory: no Antibiotics: no  Procedure: Sacro Illiac injection  Position: Prone Start Time: 9:35am  End Time: 9:41am  Fluoro Time: 12 seconds  RN/CMA Sandrina Heaton, CMA Carroll,CMA    Time 916am 9:45am    BP 109/80 108/70    Pulse 75 74    Respirations 14 16    O2 Sat 95 97%    S/S 6 6    Pain Level 2/10 2/10     D/C home with [name]-husband patient A & O X 3, D/C instructions reviewed, and sits independently.

## 2011-10-22 NOTE — Patient Instructions (Signed)
Sacroiliac Joint Dysfunction The sacroiliac joint connects the lower part of the spine (the sacrum) with the bones of the pelvis. CAUSES  Sometimes, there is no obvious reason for sacroiliac joint dysfunction. Other times, it may occur   During pregnancy.  After injury, such as:  Car accidents.  Sport-related injuries.  Work-related injuries.  Due to one leg being shorter than the other.  Due to other conditions that affect the joints, such as:  Rheumatoid arthritis.  Gout.  Psoriasis.  Joint infection (septic arthritis). SYMPTOMS  Symptoms may include:  Pain in the:  Lower back.  Buttocks.  Groin.  Thighs and legs.  Difficult sitting, standing, walking, lying, bending or lifting. DIAGNOSIS  A number of tests may be used to help diagnose the cause of sacroiliac joint dysfunction, including:  Imaging tests to look for other causes of pain, including:  MRI.  CT scan.  Bone scan.  Diagnostic injection: During a special x-ray (called fluoroscopy), a needle is put into the sacroiliac joint. A numbing medicine is injected into the joint. If the pain is improved or stopped, the diagnosis of sacroiliac joint dysfunction is more likely. TREATMENT  There are a number of types of treatment used for sacroiliac joint dysfunction, including:  Only take over-the-counter or prescription medicines for pain, discomfort, or fever as directed by your caregiver.  Medications to relax muscles.  Rest. Decreasing activity can help cut down on painful muscle spasms and allow the back to heal.  Application of heat or ice to the lower back may improve muscle spasms and soothe pain.  Brace. A special back brace, called a sacroiliac belt, can help support the joint while your back is healing.  Physical therapy can help teach comfortable positions and exercises to strengthen muscles that support the sacroiliac joint.  Cortisone injections. Injections of steroid medicine into the  joint can help decrease swelling and improve pain.  Hyaluronic acid injections. This chemical improves lubrication within the sacroiliac joint, thereby decreasing pain.  Radiofrequency ablation. A special needle is placed into the joint, where it burns away nerves that are carrying pain messages from the joint.  Surgery. Because pain occurs during movement of the joint, screws and plates may be installed in order to limit or prevent joint motion. HOME CARE INSTRUCTIONS   Take all medications exactly as directed.  Follow instructions regarding both rest and physical activity, to avoid worsening the pain.  Do physical therapy exercises exactly as prescribed. SEEK IMMEDIATE MEDICAL CARE IF:  You experience increasingly severe pain.  You develop new symptoms, such as numbness or tingling in your legs or feet.  You lose bladder or bowel control. Document Released: 03/20/2008 Document Revised: 03/16/2011 Document Reviewed: 03/20/2008 [employer] Patient Information 2013 [employer].  

## 2011-10-22 NOTE — Progress Notes (Signed)
Bilateral sacroiliac injections under fluoroscopic guidance  Indication: Low back and buttocks pain not relieved by medication management and other conservative care.  Informed consent was obtained after describing risks and benefits of the procedure with the patient, this includes bleeding, bruising, infection, paralysis and medication side effects. The patient wishes to proceed and has given written consent. The patient was placed in a prone position. The lumbar and sacral area was marked and prepped with Betadine. A 25-gauge 1-1/2 inch needle was inserted into the skin and subcutaneous tissue and 1 mL of 1% lidocaine was injected into each side. Then a 25-gauge 3 inch spinal needle was inserted under fluoroscopic guidance into the left sacroiliac joint. AP and lateral images were utilized. Omnipaque 180x0.5 mL under live fluoroscopy demonstrated no intravascular uptake. Then a solution containing one ML of 40 mg per mL Depakote met drawl in 2 ML of 2% lidocaine MPF was injected x1.5 mL. This same procedure was repeated on the right side using the same needle, injectate, and technique. Patient tolerated the procedure well. Post procedure instructions were given. Please see post procedure form.  Denies any pain in low back and buttocks post procedure

## 2011-11-24 ENCOUNTER — Encounter: Payer: Self-pay | Admitting: Physical Medicine & Rehabilitation

## 2011-11-24 ENCOUNTER — Encounter
Payer: Worker's Compensation | Attending: Physical Medicine & Rehabilitation | Admitting: Physical Medicine & Rehabilitation

## 2011-11-24 VITALS — BP 149/89 | HR 117 | Resp 14 | Ht 62.0 in | Wt 125.8 lb

## 2011-11-24 DIAGNOSIS — F3289 Other specified depressive episodes: Secondary | ICD-10-CM | POA: Insufficient documentation

## 2011-11-24 DIAGNOSIS — M461 Sacroiliitis, not elsewhere classified: Secondary | ICD-10-CM | POA: Insufficient documentation

## 2011-11-24 DIAGNOSIS — M961 Postlaminectomy syndrome, not elsewhere classified: Secondary | ICD-10-CM | POA: Insufficient documentation

## 2011-11-24 DIAGNOSIS — IMO0002 Reserved for concepts with insufficient information to code with codable children: Secondary | ICD-10-CM | POA: Insufficient documentation

## 2011-11-24 DIAGNOSIS — F329 Major depressive disorder, single episode, unspecified: Secondary | ICD-10-CM | POA: Insufficient documentation

## 2011-11-24 MED ORDER — BACLOFEN 10 MG PO TABS: 10.0000 mg | ORAL_TABLET | Freq: Four times a day (QID) | ORAL | Status: DC | PRN

## 2011-11-24 MED ORDER — OXYCODONE HCL 10 MG PO TABS: 10.0000 mg | ORAL_TABLET | Freq: Four times a day (QID) | ORAL | Status: DC | PRN

## 2011-11-24 NOTE — Patient Instructions (Signed)
CONTINUE WORKING ON REGULAR RANGE OF MOTION, STRETCHING, HEAT, ICE, ETC.

## 2011-11-24 NOTE — Progress Notes (Signed)
Subjective:    Patient ID: [name], female    DOB: 1955-06-17, 56 y.o.   MRN: [id number]  HPI  [name] is back regarding her low back/pelvic pain. She is working PT and is making slow progress towards goals. She has a lot of guarding and pain which sometimes limits ROM and general activity tolerance.  Anxiety remains a big issue. Dr. Andrey Campanile recently increased her pristiq to 100mg  and took her off the pamelor.   She continues to have symptoms in the leg as well as the back, but far and away her left low back pain is the most severe.   The oxycodone helped her pain. She ended up rationing them so that she wouldn't run out before she came back here.  She had the bilateral SI joints on 10/17. She experienced some relief from them which lasted about a week. She states the drop in pain was approximately 25%.       Pain Inventory Average Pain 9 Pain Right Now 7 My pain is burning, dull, stabbing, tingling and aching  In the last 24 hours, has pain interfered with the following? General activity 8 Relation with others 7 Enjoyment of life 8 What TIME of day is your pain at its worst? morning evening and night Sleep (in general) Poor  Pain is worse with: walking, sitting and standing Pain improves with: heat/ice, medication and TENS Relief from Meds: 8  Mobility walk without assistance how many minutes can you walk? 30-40 ability to climb steps?  yes do you drive?  yes  Function not employed: date last employed  I need assistance with the following:  household duties  Neuro/Psych numbness tingling spasms dizziness depression anxiety  Prior Studies Any changes since last visit?  no  Physicians involved in your care Any changes since last visit?  no   Family History  Problem Relation Age of Onset  . Heart disease Mother   . Diabetes Mother   . Heart disease Brother    History   Social History  . Marital Status: Married    Spouse Name: N/A    Number  of Children: N/A  . Years of Education: N/A   Social History Main Topics  . Smoking status: Former Games developer  . Smokeless tobacco: Never Used  . Alcohol Use: None  . Drug Use: None  . Sexually Active: None   Other Topics Concern  . None   Social History Narrative  . None   Past Surgical History  Procedure Date  . Spine surgery   . Abdominal hysterectomy    Past Medical History  Diagnosis Date  . Hypertension   . Hyperlipidemia   . Depression    BP 149/89  Pulse 117  Resp 14  Ht 5\' 2"  (1.575 m)  Wt 125 lb 12.8 oz (57.063 kg)  BMI 23.01 kg/m2  SpO2 91%    Review of Systems  Constitutional: Positive for diaphoresis and unexpected weight change.  Musculoskeletal: Positive for back pain.       Spasms  Neurological: Positive for dizziness and numbness.       Tingling  Psychiatric/Behavioral: Positive for dysphoric mood. The patient is nervous/anxious.   All other systems reviewed and are negative.       Objective:   Physical Exam General: Alert and oriented x 3, No apparent distress  HEENT: Head is normocephalic, atraumatic, PERRLA, EOMI, sclera anicteric, oral mucosa pink and moist, dentition intact, ext ear canals clear,  Neck: Supple without JVD  or lymphadenopathy  Heart: Reg rate and rhythm. No murmurs rubs or gallops  Chest: CTA bilaterally without wheezes, rales, or rhonchi; no distress  Abdomen: Soft, non-tender, non-distended, bowel sounds positive.  Extremities: No clubbing, cyanosis, or edema. Pulses are 2+  Skin: Clean and intact without signs of breakdown  Neuro: Pt is cognitively appropriate with normal insight, memory, and awareness. Cranial nerves 2-12 are intact. Sensory exam is normal except for the dorsum and sole of the left foot. Reflexes are 2+ in all 4's.(even the bilateral achilles) Fine motor coordination is intact. No tremors. Motor function is grossly 5/5 in the upper extremities. RLE is grossly 4-5/5 with pain inhibition proximally. LLE is  3-4 prox (pain), 4/5 with KE and KF, 3-4 at the ankle with APF and ADF but she was not very consistent with effort however. She walked with a slightly wide based gait. She seemed to have some difficulty clearing the left leg in swing phase.  Musculoskeletal: SLR was positive on the left, equivocal on the right. Seated slump test was positive on the left. Left hamstrings, gastroc, and paraspinals were all tight with PROM. She had mild pain and significant spasm with palpation over the lumbar paraspinals particularly in the upper segments. . PSIS's were very tender. FABER test was positive bilaterally.  l.  Psych: Pt's affect is flat and she remained tearful at times. Pt is cooperative. I saw no signs of symptom magnification.  Assessment & Plan:   1. Lumbar post laminectomy syndrome  2. Chronic left L5 and S1 Radiculitis  3. Reactive Depression and anxiety 4. Bilateral sacroiliitis   Plan:  1. Will make referral to Dr. Rometta Emery at [facility]. Will need [facility] approval for this. I feel that her emotional state is having a dramatic impact upon her pain levels at this point, and she needs to learn better ways to manage her pain and emotional state. 2. Will continue oxycodone IR 10mg  but increase her rx to #90 to allow TID dosing on average. Consider long acting opiate also. 3. Pristiq increased two weeks ago by Dr. Andrey Campanile.--I concur with this increase. Observe for effect over the next few weeks. She is off pamelor currently- which is fine.   4. Continue with PT per plan. She will need to maintain a HEP as well. 5. Consider follow up SI injections, as she did have some relief with these. Will hold off for now., 6.  DC flexeril and begin trial of baclofen for muscle spasm. Advised her to begin at night with this initially.  7. All questions were encouraged and answered

## 2011-12-23 ENCOUNTER — Encounter: Payer: Worker's Compensation | Admitting: Physical Medicine & Rehabilitation

## 2012-01-05 ENCOUNTER — Encounter: Payer: Self-pay | Admitting: Physical Medicine and Rehabilitation

## 2012-01-05 ENCOUNTER — Encounter
Payer: Worker's Compensation | Attending: Physical Medicine and Rehabilitation | Admitting: Physical Medicine and Rehabilitation

## 2012-01-05 VITALS — BP 119/81 | HR 80 | Resp 14 | Ht 62.0 in | Wt 126.0 lb

## 2012-01-05 DIAGNOSIS — I10 Essential (primary) hypertension: Secondary | ICD-10-CM | POA: Insufficient documentation

## 2012-01-05 DIAGNOSIS — F341 Dysthymic disorder: Secondary | ICD-10-CM | POA: Insufficient documentation

## 2012-01-05 DIAGNOSIS — M545 Low back pain, unspecified: Secondary | ICD-10-CM | POA: Insufficient documentation

## 2012-01-05 DIAGNOSIS — M461 Sacroiliitis, not elsewhere classified: Secondary | ICD-10-CM

## 2012-01-05 DIAGNOSIS — R209 Unspecified disturbances of skin sensation: Secondary | ICD-10-CM | POA: Insufficient documentation

## 2012-01-05 DIAGNOSIS — IMO0002 Reserved for concepts with insufficient information to code with codable children: Secondary | ICD-10-CM

## 2012-01-05 DIAGNOSIS — M961 Postlaminectomy syndrome, not elsewhere classified: Secondary | ICD-10-CM | POA: Insufficient documentation

## 2012-01-05 DIAGNOSIS — E785 Hyperlipidemia, unspecified: Secondary | ICD-10-CM | POA: Insufficient documentation

## 2012-01-05 MED ORDER — OXYCODONE HCL 10 MG PO TABS: 10.0000 mg | ORAL_TABLET | Freq: Four times a day (QID) | ORAL | Status: DC | PRN

## 2012-01-05 NOTE — Patient Instructions (Signed)
Continue with staying as active as pain permits. Look into aquatic exercising, try to find out whether workers comp. would support this. Also talk to your cardiologist, whether he will clear you for exercising in the water.

## 2012-01-05 NOTE — Progress Notes (Signed)
Subjective:    Patient ID: [name], female    DOB: [birth year], 56 y.o.   MRN: [id number]  HPI The patient complains about chronic low back pain which radiates into her left LE, posteriorly. The patient also complains about numbness and tingling in the same distribution. The problem has been stable .  She first injured her back on 03/25/09 when she was at work and experienced sudden onset of back and left leg pain while she was twisting with and lifting a box. MRI of the lumbar spine revealed L5-S1 disc broad based disc bulge, facet arthropathy, and bi-foraminal stenosis. She had a left S1 nerve root block by Dr. Ethelene Hal in June of 2011 which did not help. After the injection she appears to have coincidentally developed symptomatic coronary artery stenosis and had a stent placed. Apparently a year later she had further cardiac issues and ultimately had an angioplasty performed in the same vessel.  After failing conservative management, and because her symptoms were not improving, [name] ultimately opted for an L5-S1 posterior fusion and left hemi-laminectomy, bilateral facectomy in March 2012 by Dr. Venita Lick. She went through a course of physical therapy post-operatively for a period of 2 or 3 months. The therapy ([facility]) addressed ROM and strength in her back and legs. She feels that she would have benefited from further therapies.  Pain Inventory Average Pain 9 Pain Right Now 9 My pain is sharp, burning, tingling and aching  In the last 24 hours, has pain interfered with the following? General activity 8 Relation with others 8 Enjoyment of life 8 What TIME of day is your pain at its worst? morning and night Sleep (in general) Poor  Pain is worse with: walking, sitting and standing Pain improves with: rest, heat/ice, therapy/exercise, medication and TENS Relief from Meds: 7  Mobility use a cane how many minutes can you walk? 30-45 ability to climb steps?  yes do you  drive?  yes transfers alone Do you have any goals in this area?  yes  Function not employed: date last employed  I need assistance with the following:  household duties and shopping Do you have any goals in this area?  yes  Neuro/Psych weakness numbness tingling trouble walking spasms dizziness depression anxiety  Prior Studies Had lab work done with cardiologist  Physicians involved in your care Any changes since last visit?  no   Family History  Problem Relation Age of Onset  . Heart disease Mother   . Diabetes Mother   . Heart disease Brother    History   Social History  . Marital Status: Married    Spouse Name: N/A    Number of Children: N/A  . Years of Education: N/A   Social History Main Topics  . Smoking status: Former Games developer  . Smokeless tobacco: Never Used  . Alcohol Use: None  . Drug Use: None  . Sexually Active: None   Other Topics Concern  . None   Social History Narrative  . None   Past Surgical History  Procedure Date  . Spine surgery   . Abdominal hysterectomy    Past Medical History  Diagnosis Date  . Hypertension   . Hyperlipidemia   . Depression    BP 119/81  Pulse 80  Resp 14  Ht 5\' 2"  (1.575 m)  Wt 126 lb (57.153 kg)  BMI 23.05 kg/m2  SpO2 96%     Review of Systems  Constitutional: Positive for fever, chills, diaphoresis  and unexpected weight change.  Respiratory: Positive for shortness of breath.   Gastrointestinal: Positive for nausea and constipation.  Musculoskeletal: Positive for myalgias, back pain, arthralgias and gait problem.  Neurological: Positive for dizziness, weakness and numbness.  Psychiatric/Behavioral: Positive for dysphoric mood. The patient is nervous/anxious.   All other systems reviewed and are negative.       Objective:   Physical Exam  Constitutional: She is oriented to person, place, and time. She appears well-developed and well-nourished.  HENT:  Head: Normocephalic.  Neck: Neck  supple.  Musculoskeletal: She exhibits tenderness.  Neurological: She is alert and oriented to person, place, and time.  Skin: Skin is warm and dry.  Psychiatric: She has a normal mood and affect.    Symmetric normal motor tone is noted throughout. Normal muscle bulk. Muscle testing reveals 5/5 muscle strength of the upper extremity, and 5/5 of the lower extremity, except gluteus med,and min. On the left 4-/5. Full range of motion in upper and lower extremities. ROM of spine is restricted. Fine motor movements are normal in both hands.  DTR in the upper and lower extremity are present and symmetric 2+. No clonus is noted.  Patient arises from chair without difficulty. Narrow based gait with normal arm swing bilateral , able to walk on heels and toes . Tandem walk is stable. No pronator drift. Rhomberg negative.        Assessment & Plan:  1. Lumbar post laminectomy syndrome  2. Chronic left L5 and S1 Radiculitis  3. Reactive Depression  Plan:  1. Continue with PT, wrote note to PT, for strengthening abductors of left hip, in addition to core program. 2. Recommended aquatic exercising, after concluding PT, for long term spine health  .   3. Refilled Oxycodone 10mg  #90,  Patient states, that she is doing ok with her medication regimen at this point.   All questions were encouraged and answered   See her back in about a month.

## 2012-02-03 ENCOUNTER — Encounter: Payer: Self-pay | Admitting: Physical Medicine and Rehabilitation

## 2012-02-03 ENCOUNTER — Encounter
Payer: Worker's Compensation | Attending: Physical Medicine and Rehabilitation | Admitting: Physical Medicine and Rehabilitation

## 2012-02-03 VITALS — BP 128/92 | HR 71 | Resp 14 | Ht 62.0 in | Wt 127.0 lb

## 2012-02-03 DIAGNOSIS — R209 Unspecified disturbances of skin sensation: Secondary | ICD-10-CM | POA: Insufficient documentation

## 2012-02-03 DIAGNOSIS — E785 Hyperlipidemia, unspecified: Secondary | ICD-10-CM | POA: Insufficient documentation

## 2012-02-03 DIAGNOSIS — I2510 Atherosclerotic heart disease of native coronary artery without angina pectoris: Secondary | ICD-10-CM | POA: Insufficient documentation

## 2012-02-03 DIAGNOSIS — M545 Low back pain, unspecified: Secondary | ICD-10-CM | POA: Insufficient documentation

## 2012-02-03 DIAGNOSIS — I10 Essential (primary) hypertension: Secondary | ICD-10-CM | POA: Insufficient documentation

## 2012-02-03 DIAGNOSIS — F341 Dysthymic disorder: Secondary | ICD-10-CM | POA: Insufficient documentation

## 2012-02-03 DIAGNOSIS — M961 Postlaminectomy syndrome, not elsewhere classified: Secondary | ICD-10-CM | POA: Insufficient documentation

## 2012-02-03 DIAGNOSIS — Z981 Arthrodesis status: Secondary | ICD-10-CM | POA: Insufficient documentation

## 2012-02-03 DIAGNOSIS — G8929 Other chronic pain: Secondary | ICD-10-CM

## 2012-02-03 DIAGNOSIS — IMO0002 Reserved for concepts with insufficient information to code with codable children: Secondary | ICD-10-CM

## 2012-02-03 DIAGNOSIS — M461 Sacroiliitis, not elsewhere classified: Secondary | ICD-10-CM

## 2012-02-03 MED ORDER — OXYCODONE HCL 10 MG PO TABS: 10.0000 mg | ORAL_TABLET | Freq: Four times a day (QID) | ORAL | Status: DC | PRN

## 2012-02-03 NOTE — Patient Instructions (Addendum)
Continue with PT, ask your cardiologist whether you could do aquatic exercises. Also ask your cardiologist, whether you could use Flector patches ( diclofenac patch) for aggrevated low back pain .

## 2012-02-03 NOTE — Progress Notes (Signed)
Subjective:    Patient ID: [name], female    DOB: 1955-02-03, 57 y.o.   MRN: [id number]  HPI The patient complains about chronic low back pain which radiates into her left LE, posteriorly. The patient also complains about numbness and tingling in the same distribution.  The problem has been stable . Patient reports, that she was admitted to the hospital from 1/9 - 01/18/12, for H1N1, she is still recovering.She continues to do PT, she also does the exercises at home regularly. She first injured her back on 03/25/09 when she was at work and experienced sudden onset of back and left leg pain while she was twisting with and lifting a box. MRI of the lumbar spine revealed L5-S1 disc broad based disc bulge, facet arthropathy, and bi-foraminal stenosis. She had a left S1 nerve root block by Dr. Ethelene Hal in June of 2011 which did not help. After the injection she appears to have coincidentally developed symptomatic coronary artery stenosis and had a stent placed. Apparently a year later she had further cardiac issues and ultimately had an angioplasty performed in the same vessel.  After failing conservative management, and because her symptoms were not improving, [name] ultimately opted for an L5-S1 posterior fusion and left hemi-laminectomy, bilateral facectomy in March 2012 by Dr. Venita Lick. She went through a course of physical therapy post-operatively for a period of 2 or 3 months. The therapy ([facility]) addressed ROM and strength in her back and legs. She feels that she would have benefited from further therapies.  Pain Inventory Average Pain 7 Pain Right Now 6 My pain is burning, stabbing, tingling and aching  In the last 24 hours, has pain interfered with the following? General activity 7 Relation with others 7 Enjoyment of life 8 What TIME of day is your pain at its worst? morning, evening and night Sleep (in general) Fair  Pain is worse with: walking, sitting and standing Pain  improves with: rest, heat/ice, medication and TENS Relief from Meds: 7  Mobility walk without assistance use a cane how many minutes can you walk? 20-30 ability to climb steps?  yes do you drive?  yes transfers alone Do you have any goals in this area?  yes  Function not employed: date last employed   Neuro/Psych weakness tingling depression anxiety loss of taste or smell  Prior Studies Any changes since last visit?  no  Physicians involved in your care Any changes since last visit?  no   Family History  Problem Relation Age of Onset  . Heart disease Mother   . Diabetes Mother   . Heart disease Brother    History   Social History  . Marital Status: Married    Spouse Name: N/A    Number of Children: N/A  . Years of Education: N/A   Social History Main Topics  . Smoking status: Former Games developer  . Smokeless tobacco: Never Used  . Alcohol Use: None  . Drug Use: None  . Sexually Active: None   Other Topics Concern  . None   Social History Narrative  . None   Past Surgical History  Procedure Date  . Spine surgery   . Abdominal hysterectomy    Past Medical History  Diagnosis Date  . Hypertension   . Hyperlipidemia   . Depression    BP 128/92  Pulse 71  Resp 14  Ht 5\' 2"  (1.575 m)  Wt 127 lb (57.607 kg)  BMI 23.23 kg/m2  SpO2 97%  Review of Systems  Constitutional: Positive for diaphoresis and unexpected weight change.  Respiratory: Positive for shortness of breath.   Neurological: Positive for weakness.  Psychiatric/Behavioral: Positive for dysphoric mood. The patient is nervous/anxious.   All other systems reviewed and are negative.       Objective:   Physical Exam Constitutional: She is oriented to person, place, and time. She appears well-developed and well-nourished.  HENT:  Head: Normocephalic.  Neck: Neck supple.  Musculoskeletal: She exhibits tenderness.  Neurological: She is alert and oriented to person, place, and time.   Skin: Skin is warm and dry.  Psychiatric: She has a normal mood and affect.   Symmetric normal motor tone is noted throughout. Normal muscle bulk. Muscle testing reveals 5/5 muscle strength of the upper extremity, and 5/5 of the lower extremity, except gluteus med,and min. On the left 4-/5. Full range of motion in upper and lower extremities. ROM of spine is restricted. Fine motor movements are normal in both hands.  DTR in the upper and lower extremity are present and symmetric 2+. No clonus is noted.  Patient arises from chair without difficulty. Narrow based gait with normal arm swing bilateral , able to walk on heels and toes . Tandem walk is stable. No pronator drift. Rhomberg negative.        Assessment & Plan:  1. Lumbar post laminectomy syndrome  2. Chronic left L5 and S1 Radiculitis  3. Reactive Depression  4. H1N1, admitted to Hospital from 1/9- 01/18/12, recovering Plan:  1. Continue with PT, wrote note to PT, for strengthening abductors of left hip, in addition to core program.  2. Recommended aquatic exercising, after concluding PT, for long term spine health, advised patient to ask her cardiologist, whether he would approve this. Also recommended Flector patches, for exacerbated LBP, advised patient to also ask her cardiologist, if he would approve .  3. Refilled Oxycodone 10mg  #90,  Patient states, that she is doing ok with her medication regimen at this point.  All questions were encouraged and answered  See her back in about a month.

## 2012-02-16 ENCOUNTER — Telehealth: Payer: Self-pay | Admitting: Physical Medicine & Rehabilitation

## 2012-02-16 NOTE — Telephone Encounter (Signed)
[place] with Dr. Lincoln Brigham office called about status on a referral to their office.  Need to know if Worker's Comp is approving this visit.  Please call her at [phone] ext. [phone]

## 2012-02-22 NOTE — Telephone Encounter (Signed)
Left message for [name] regarding referral to Dr Sherron Monday office.

## 2012-02-25 ENCOUNTER — Telehealth: Payer: Self-pay | Admitting: Physical Medicine & Rehabilitation

## 2012-02-25 NOTE — Telephone Encounter (Signed)
Missy from [employer] [phone] called @ [phone]. Patient is authorized for an evaluation with Dr. Gennie Alma.

## 2012-03-03 ENCOUNTER — Encounter: Payer: Self-pay | Admitting: Physical Medicine and Rehabilitation

## 2012-03-03 ENCOUNTER — Encounter
Payer: Worker's Compensation | Attending: Physical Medicine and Rehabilitation | Admitting: Physical Medicine and Rehabilitation

## 2012-03-03 VITALS — BP 135/75 | HR 74 | Resp 14 | Ht 62.0 in | Wt 125.0 lb

## 2012-03-03 DIAGNOSIS — M461 Sacroiliitis, not elsewhere classified: Secondary | ICD-10-CM

## 2012-03-03 DIAGNOSIS — G8929 Other chronic pain: Secondary | ICD-10-CM | POA: Insufficient documentation

## 2012-03-03 DIAGNOSIS — IMO0002 Reserved for concepts with insufficient information to code with codable children: Secondary | ICD-10-CM

## 2012-03-03 DIAGNOSIS — Z8619 Personal history of other infectious and parasitic diseases: Secondary | ICD-10-CM | POA: Insufficient documentation

## 2012-03-03 DIAGNOSIS — M545 Low back pain: Secondary | ICD-10-CM

## 2012-03-03 DIAGNOSIS — F341 Dysthymic disorder: Secondary | ICD-10-CM | POA: Insufficient documentation

## 2012-03-03 DIAGNOSIS — M961 Postlaminectomy syndrome, not elsewhere classified: Secondary | ICD-10-CM

## 2012-03-03 DIAGNOSIS — Z5181 Encounter for therapeutic drug level monitoring: Secondary | ICD-10-CM

## 2012-03-03 MED ORDER — OXYCODONE HCL 10 MG PO TABS: 10.0000 mg | ORAL_TABLET | Freq: Four times a day (QID) | ORAL | Status: DC | PRN

## 2012-03-03 NOTE — Progress Notes (Signed)
Subjective:    Patient ID: [name], female    DOB: 28-Jun-1955, 57 y.o.   MRN: [id number]  HPI The patient complains about chronic low back pain which radiates into her left LE, posteriorly. The patient also complains about numbness and tingling in the same distribution.  The problem has been stable . Patient reports, that she was admitted to the hospital from 1/9 - 01/18/12, for H1N1.She continues to do PT, she also does the exercises at home regularly.  She first injured her back on 03/25/09 when she was at work and experienced sudden onset of back and left leg pain while she was twisting with and lifting a box. MRI of the lumbar spine revealed L5-S1 disc broad based disc bulge, facet arthropathy, and bi-foraminal stenosis. She had a left S1 nerve root block by Dr. Ethelene Hal in June of 2011 which did not help. After the injection she appears to have coincidentally developed symptomatic coronary artery stenosis and had a stent placed. Apparently a year later she had further cardiac issues and ultimately had an angioplasty performed in the same vessel.  After failing conservative management, and because her symptoms were not improving, [name] ultimately opted for an L5-S1 posterior fusion and left hemi-laminectomy, bilateral facectomy in March 2012 by Dr. Venita Lick. She went through a course of physical therapy post-operatively for a period of 2 or 3 months. The therapy ([facility]) addressed ROM and strength in her back and legs. She feels that she would have benefited from further therapies.  Pain Inventory Average Pain 6 Pain Right Now 6 My pain is burning, stabbing, tingling and aching  In the last 24 hours, has pain interfered with the following? General activity 8 Relation with others 8 Enjoyment of life 10 What TIME of day is your pain at its worst? morning, evening and night Sleep (in general) Fair  Pain is worse with: walking, sitting and standing Pain improves with:  medication Relief from Meds: 9  Mobility use a cane how many minutes can you walk? 30-45 ability to climb steps?  yes do you drive?  yes transfers alone Do you have any goals in this area?  yes  Function not employed: date last employed 03/11 I need assistance with the following:  household duties and shopping  Neuro/Psych tingling spasms depression anxiety  Prior Studies Any changes since last visit?  no  Physicians involved in your care Any changes since last visit?  no   Family History  Problem Relation Age of Onset  . Heart disease Mother   . Diabetes Mother   . Heart disease Brother    History   Social History  . Marital Status: Married    Spouse Name: N/A    Number of Children: N/A  . Years of Education: N/A   Social History Main Topics  . Smoking status: Former Games developer  . Smokeless tobacco: Never Used  . Alcohol Use: None  . Drug Use: None  . Sexually Active: None   Other Topics Concern  . None   Social History Narrative  . None   Past Surgical History  Procedure Laterality Date  . Spine surgery    . Abdominal hysterectomy     Past Medical History  Diagnosis Date  . Hypertension   . Hyperlipidemia   . Depression    BP 135/75  Pulse 74  Resp 14  Ht 5\' 2"  (1.575 m)  Wt 125 lb (56.7 kg)  BMI 22.86 kg/m2  SpO2 98%  Review of Systems  Constitutional: Positive for diaphoresis.  Respiratory: Positive for shortness of breath.   Musculoskeletal: Positive for back pain.  Psychiatric/Behavioral: Positive for dysphoric mood. The patient is nervous/anxious.   All other systems reviewed and are negative.       Objective:   Physical Exam Constitutional: She is oriented to person, place, and time. She appears well-developed and well-nourished.  HENT:  Head: Normocephalic.  Neck: Neck supple.  Musculoskeletal: She exhibits tenderness.  Neurological: She is alert and oriented to person, place, and time.  Skin: Skin is warm and dry.   Psychiatric: She has a normal mood and affect.  Symmetric normal motor tone is noted throughout. Normal muscle bulk. Muscle testing reveals 5/5 muscle strength of the upper extremity, and 5/5 of the lower extremity, except gluteus med,and min. On the left 4-/5. Full range of motion in upper and lower extremities. ROM of spine is restricted. Fine motor movements are normal in both hands.  DTR in the upper and lower extremity are present and symmetric 2+. No clonus is noted.  Patient arises from chair without difficulty. Narrow based gait with normal arm swing bilateral , able to walk on heels and toes . Tandem walk is stable. No pronator drift. Rhomberg negative.        Assessment & Plan:  1. Lumbar post laminectomy syndrome  2. Chronic left L5 and S1 Radiculitis  3. Reactive Depression  4. H1N1, admitted to Hospital from 1/9- 01/18/12,resolved  Plan:  1. Continue with PT, wrote note to PT, for strengthening abductors of left hip, in addition to core program.  2. Recommended aquatic exercising, after concluding PT, for long term spine health, advised patient to ask her cardiologist, whether he would approve this. Also recommended Flector patches, for exacerbated LBP, advised patient to also ask her cardiologist, if he would approve .  3. Refilled Oxycodone 10mg  #90,  Patient states, that she is doing ok with her medication regimen at this point.  All questions were encouraged and answered  See her back in about a month.

## 2012-03-03 NOTE — Patient Instructions (Signed)
Continue with your PT, try to stay as active as tolerated.

## 2012-03-07 ENCOUNTER — Telehealth: Payer: Self-pay | Admitting: *Deleted

## 2012-03-07 NOTE — Telephone Encounter (Signed)
Ruthell saw Dr Riley Kill the other day and she was uncertain about continuing PT. They need a copy of the notes signed they sent over and a signed RX for PT.  Their fax number is #[phone]

## 2012-03-08 NOTE — Telephone Encounter (Signed)
Left message at [facility] for someone to call and give more info on what they need.  Also gave fax number so they could fax orders.

## 2012-03-09 ENCOUNTER — Telehealth: Payer: Self-pay

## 2012-03-09 DIAGNOSIS — M961 Postlaminectomy syndrome, not elsewhere classified: Secondary | ICD-10-CM

## 2012-03-09 DIAGNOSIS — F329 Major depressive disorder, single episode, unspecified: Secondary | ICD-10-CM

## 2012-03-09 DIAGNOSIS — IMO0002 Reserved for concepts with insufficient information to code with codable children: Secondary | ICD-10-CM

## 2012-03-09 NOTE — Telephone Encounter (Signed)
Clydie Braun at [facility] called to find out if patient is to continue care and if so they need a new order faxed to [phone].  Please advise and order.

## 2012-03-09 NOTE — Telephone Encounter (Signed)
A new order can be faxed

## 2012-03-10 NOTE — Telephone Encounter (Signed)
New order placed and faxed to [facility].

## 2012-03-30 ENCOUNTER — Encounter
Payer: Worker's Compensation | Attending: Physical Medicine and Rehabilitation | Admitting: Physical Medicine and Rehabilitation

## 2012-04-27 ENCOUNTER — Other Ambulatory Visit (HOSPITAL_COMMUNITY): Payer: Self-pay | Admitting: Obstetrics and Gynecology

## 2012-04-27 DIAGNOSIS — Z1231 Encounter for screening mammogram for malignant neoplasm of breast: Secondary | ICD-10-CM

## 2012-05-04 ENCOUNTER — Ambulatory Visit (HOSPITAL_COMMUNITY)
Admission: RE | Admit: 2012-05-04 | Discharge: 2012-05-04 | Disposition: A | Discharge status: Home / Self Care | Payer: Medicaid Other | Source: Ambulatory Visit | Attending: Obstetrics and Gynecology | Admitting: Obstetrics and Gynecology

## 2012-05-04 DIAGNOSIS — Z1231 Encounter for screening mammogram for malignant neoplasm of breast: Secondary | ICD-10-CM

## 2012-05-11 ENCOUNTER — Encounter: Payer: Medicaid Other | Admitting: Physical Medicine & Rehabilitation

## 2012-07-29 ENCOUNTER — Encounter: Payer: Self-pay | Admitting: *Deleted

## 2012-08-02 ENCOUNTER — Encounter: Payer: Self-pay | Admitting: Physical Medicine & Rehabilitation

## 2012-08-02 ENCOUNTER — Encounter: Payer: Medicaid Other | Attending: Physical Medicine & Rehabilitation | Admitting: Physical Medicine & Rehabilitation

## 2012-08-02 VITALS — BP 132/83 | HR 72 | Resp 15 | Ht 62.0 in | Wt 128.0 lb

## 2012-08-02 DIAGNOSIS — E785 Hyperlipidemia, unspecified: Secondary | ICD-10-CM | POA: Insufficient documentation

## 2012-08-02 DIAGNOSIS — F341 Dysthymic disorder: Secondary | ICD-10-CM | POA: Insufficient documentation

## 2012-08-02 DIAGNOSIS — M461 Sacroiliitis, not elsewhere classified: Secondary | ICD-10-CM | POA: Insufficient documentation

## 2012-08-02 DIAGNOSIS — I10 Essential (primary) hypertension: Secondary | ICD-10-CM | POA: Insufficient documentation

## 2012-08-02 DIAGNOSIS — F329 Major depressive disorder, single episode, unspecified: Secondary | ICD-10-CM

## 2012-08-02 DIAGNOSIS — Z79899 Other long term (current) drug therapy: Secondary | ICD-10-CM | POA: Insufficient documentation

## 2012-08-02 DIAGNOSIS — M961 Postlaminectomy syndrome, not elsewhere classified: Secondary | ICD-10-CM | POA: Insufficient documentation

## 2012-08-02 DIAGNOSIS — IMO0002 Reserved for concepts with insufficient information to code with codable children: Secondary | ICD-10-CM

## 2012-08-02 MED ORDER — GABAPENTIN 100 MG PO CAPS: 100.0000 mg | ORAL_CAPSULE | Freq: Three times a day (TID) | ORAL | Status: DC

## 2012-08-02 MED ORDER — BACLOFEN 10 MG PO TABS: 10.0000 mg | ORAL_TABLET | Freq: Four times a day (QID) | ORAL | Status: DC | PRN

## 2012-08-02 MED ORDER — OXYCODONE HCL 10 MG PO TABS: 10.0000 mg | ORAL_TABLET | Freq: Four times a day (QID) | ORAL | Status: DC | PRN

## 2012-08-02 NOTE — Addendum Note (Signed)
Addended by: Doreene Eland on: 08/02/2012 02:01 PM   Modules accepted: Orders
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
## 2012-08-02 NOTE — Patient Instructions (Signed)
WORK DAILY ON BACK POSTURE, RANGE OF MOTION, STRENGTH AND GENERALLY ACTIVITY TO IMPROVE YOUR ACTIVITY TOLERANE.

## 2012-08-10 ENCOUNTER — Telehealth: Payer: Self-pay

## 2012-08-10 NOTE — Telephone Encounter (Signed)
Message copied by Judd Gaudier on Wed Aug 10, 2012 12:52 PM ------      Message from: Su Monks      Created: Tue Aug 09, 2012  4:13 PM       D/C patient, violated the contract. She can wean down of her Oxycodone 10mg  tid, by taking it bid for one week, then 1 tablet per day , for one week then stop. We can also provide her with a list of other pain clinics. ------

## 2012-08-10 NOTE — Telephone Encounter (Signed)
Left message for patient to call office regarding inconsistent urine drug screen and discharge from practice.  Letter Mailed.

## 2012-08-11 ENCOUNTER — Telehealth: Payer: Self-pay

## 2012-08-11 NOTE — Telephone Encounter (Signed)
Message copied by Judd Gaudier on Thu Aug 11, 2012 11:52 AM ------      Message from: Su Monks      Created: Tue Aug 09, 2012  4:13 PM       D/C patient, violated the contract. She can wean down of her Oxycodone 10mg  tid, by taking it bid for one week, then 1 tablet per day , for one week then stop. We can also provide her with a list of other pain clinics. ------

## 2012-08-11 NOTE — Telephone Encounter (Signed)
Left message for patient to call office to inform her she has been discharged due to inconsistent urine drug screen.

## 2012-08-30 ENCOUNTER — Ambulatory Visit: Payer: Self-pay | Admitting: Physical Medicine & Rehabilitation

## 2013-06-27 ENCOUNTER — Other Ambulatory Visit: Payer: Self-pay | Admitting: Pain Medicine

## 2013-06-27 DIAGNOSIS — M545 Low back pain, unspecified: Secondary | ICD-10-CM

## 2013-07-04 ENCOUNTER — Ambulatory Visit
Admission: RE | Admit: 2013-07-04 | Discharge: 2013-07-04 | Disposition: A | Discharge status: Home / Self Care | Payer: Medicaid Other | Source: Ambulatory Visit | Attending: Pain Medicine | Admitting: Pain Medicine

## 2013-07-04 DIAGNOSIS — M545 Low back pain, unspecified: Secondary | ICD-10-CM

## 2013-07-04 MED ORDER — GADOBENATE DIMEGLUMINE 529 MG/ML IV SOLN
10.0000 mL | Freq: Once | INTRAVENOUS | Status: AC | PRN
  Administered 2013-07-04: 10 mL via INTRAVENOUS

## 2013-07-05 ENCOUNTER — Other Ambulatory Visit: Payer: Self-pay | Admitting: Family Medicine

## 2013-07-05 ENCOUNTER — Ambulatory Visit
Admission: RE | Admit: 2013-07-05 | Discharge: 2013-07-05 | Disposition: A | Discharge status: Home / Self Care | Payer: Medicaid Other | Source: Ambulatory Visit | Attending: Family Medicine | Admitting: Family Medicine

## 2013-07-05 DIAGNOSIS — S5002XS Contusion of left elbow, sequela: Secondary | ICD-10-CM

## 2013-09-19 ENCOUNTER — Emergency Department (HOSPITAL_COMMUNITY)
Admission: EM | Admit: 2013-09-19 | Discharge: 2013-09-19 | Disposition: A | Discharge status: Home / Self Care | Payer: Medicaid Other | Attending: Emergency Medicine | Admitting: Emergency Medicine

## 2013-09-19 ENCOUNTER — Emergency Department (HOSPITAL_COMMUNITY): Payer: Medicaid Other

## 2013-09-19 ENCOUNTER — Encounter (HOSPITAL_COMMUNITY): Payer: Self-pay | Admitting: Emergency Medicine

## 2013-09-19 DIAGNOSIS — I10 Essential (primary) hypertension: Secondary | ICD-10-CM | POA: Insufficient documentation

## 2013-09-19 DIAGNOSIS — F329 Major depressive disorder, single episode, unspecified: Secondary | ICD-10-CM | POA: Diagnosis not present

## 2013-09-19 DIAGNOSIS — K219 Gastro-esophageal reflux disease without esophagitis: Secondary | ICD-10-CM | POA: Diagnosis not present

## 2013-09-19 DIAGNOSIS — F3289 Other specified depressive episodes: Secondary | ICD-10-CM | POA: Diagnosis not present

## 2013-09-19 DIAGNOSIS — S1093XA Contusion of unspecified part of neck, initial encounter: Secondary | ICD-10-CM | POA: Diagnosis not present

## 2013-09-19 DIAGNOSIS — E785 Hyperlipidemia, unspecified: Secondary | ICD-10-CM | POA: Insufficient documentation

## 2013-09-19 DIAGNOSIS — Z87891 Personal history of nicotine dependence: Secondary | ICD-10-CM | POA: Diagnosis not present

## 2013-09-19 DIAGNOSIS — S6990XA Unspecified injury of unspecified wrist, hand and finger(s), initial encounter: Secondary | ICD-10-CM | POA: Diagnosis not present

## 2013-09-19 DIAGNOSIS — S0083XA Contusion of other part of head, initial encounter: Principal | ICD-10-CM | POA: Insufficient documentation

## 2013-09-19 DIAGNOSIS — S0990XA Unspecified injury of head, initial encounter: Secondary | ICD-10-CM | POA: Diagnosis present

## 2013-09-19 DIAGNOSIS — IMO0002 Reserved for concepts with insufficient information to code with codable children: Secondary | ICD-10-CM | POA: Insufficient documentation

## 2013-09-19 DIAGNOSIS — Z79899 Other long term (current) drug therapy: Secondary | ICD-10-CM | POA: Diagnosis not present

## 2013-09-19 DIAGNOSIS — F411 Generalized anxiety disorder: Secondary | ICD-10-CM | POA: Diagnosis not present

## 2013-09-19 DIAGNOSIS — S46909A Unspecified injury of unspecified muscle, fascia and tendon at shoulder and upper arm level, unspecified arm, initial encounter: Secondary | ICD-10-CM | POA: Diagnosis not present

## 2013-09-19 DIAGNOSIS — S0003XA Contusion of scalp, initial encounter: Secondary | ICD-10-CM | POA: Insufficient documentation

## 2013-09-19 DIAGNOSIS — W19XXXA Unspecified fall, initial encounter: Secondary | ICD-10-CM

## 2013-09-19 DIAGNOSIS — S4980XA Other specified injuries of shoulder and upper arm, unspecified arm, initial encounter: Secondary | ICD-10-CM | POA: Insufficient documentation

## 2013-09-19 DIAGNOSIS — S59919A Unspecified injury of unspecified forearm, initial encounter: Secondary | ICD-10-CM

## 2013-09-19 DIAGNOSIS — Z7982 Long term (current) use of aspirin: Secondary | ICD-10-CM | POA: Insufficient documentation

## 2013-09-19 DIAGNOSIS — S59909A Unspecified injury of unspecified elbow, initial encounter: Secondary | ICD-10-CM | POA: Diagnosis not present

## 2013-09-19 HISTORY — DX: Gastro-esophageal reflux disease without esophagitis: K21.9

## 2013-09-19 HISTORY — DX: Anxiety disorder, unspecified: F41.9

## 2013-09-19 LAB — I-STAT CHEM 8, ED
BUN: 9 mg/dL (ref 6–23)
CALCIUM ION: 1.16 mmol/L (ref 1.12–1.23)
Chloride: 105 mEq/L (ref 96–112)
Creatinine, Ser: 0.7 mg/dL (ref 0.50–1.10)
Glucose, Bld: 94 mg/dL (ref 70–99)
HEMATOCRIT: 39 % (ref 36.0–46.0)
HEMOGLOBIN: 13.3 g/dL (ref 12.0–15.0)
POTASSIUM: 3.5 meq/L — AB (ref 3.7–5.3)
Sodium: 144 mEq/L (ref 137–147)
TCO2: 25 mmol/L (ref 0–100)

## 2013-09-19 LAB — RAPID URINE DRUG SCREEN, HOSP PERFORMED
Amphetamines: NOT DETECTED
Barbiturates: NOT DETECTED
Benzodiazepines: POSITIVE — AB
Cocaine: NOT DETECTED
Opiates: NOT DETECTED
Tetrahydrocannabinol: NOT DETECTED

## 2013-09-19 LAB — CBC WITH DIFFERENTIAL/PLATELET
BASOS PCT: 1 % (ref 0–1)
Basophils Absolute: 0 10*3/uL (ref 0.0–0.1)
EOS ABS: 0 10*3/uL (ref 0.0–0.7)
Eosinophils Relative: 1 % (ref 0–5)
HCT: 36.5 % (ref 36.0–46.0)
Hemoglobin: 12.4 g/dL (ref 12.0–15.0)
Lymphocytes Relative: 32 % (ref 12–46)
Lymphs Abs: 1.4 10*3/uL (ref 0.7–4.0)
MCH: 32 pg (ref 26.0–34.0)
MCHC: 34 g/dL (ref 30.0–36.0)
MCV: 94.1 fL (ref 78.0–100.0)
MONO ABS: 0.4 10*3/uL (ref 0.1–1.0)
Monocytes Relative: 8 % (ref 3–12)
NEUTROS PCT: 58 % (ref 43–77)
Neutro Abs: 2.5 10*3/uL (ref 1.7–7.7)
Platelets: 176 10*3/uL (ref 150–400)
RBC: 3.88 MIL/uL (ref 3.87–5.11)
RDW: 12 % (ref 11.5–15.5)
WBC: 4.4 10*3/uL (ref 4.0–10.5)

## 2013-09-19 LAB — ETHANOL: Alcohol, Ethyl (B): <11 mg/dL (ref 0–11)

## 2013-09-19 MED ORDER — FENTANYL CITRATE 0.05 MG/ML IJ SOLN
50.0000 ug | Freq: Once | INTRAMUSCULAR | Status: AC
  Administered 2013-09-19: 50 ug via INTRAVENOUS
  Filled 2013-09-19: qty 2

## 2013-09-19 MED ORDER — NALOXONE HCL 0.4 MG/ML IJ SOLN
0.4000 mg | Freq: Once | INTRAMUSCULAR | Status: AC
  Administered 2013-09-19: 0.4 mg via INTRAVENOUS
  Filled 2013-09-19: qty 1

## 2013-09-19 MED ORDER — LORAZEPAM 2 MG/ML IJ SOLN
1.0000 mg | Freq: Once | INTRAMUSCULAR | Status: AC
  Administered 2013-09-19: 1 mg via INTRAVENOUS
  Filled 2013-09-19: qty 1

## 2013-09-19 MED ORDER — SODIUM CHLORIDE 0.9 % IV BOLUS (SEPSIS)
1000.0000 mL | Freq: Once | INTRAVENOUS | Status: AC
  Administered 2013-09-19: 1000 mL via INTRAVENOUS
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
## 2013-09-19 NOTE — ED Notes (Signed)
Pt's mother to bedside  

## 2013-09-19 NOTE — ED Notes (Signed)
Family at bedside. 
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
## 2013-09-19 NOTE — ED Notes (Signed)
Back from CT

## 2013-09-19 NOTE — Consult Note (Signed)
If flex ex OK would D/C cervical collar. D/W ED staff. Likely psychiatric in origin. If persists, recommend psychiatry eval. No need for trauma admission noted. I spoke with her brother in [place] by phone. Patient examined and I agree with the assessment and plan  Violeta Gelinas, MD, MPH, FACS Trauma: [phone] General Surgery: [phone]  09/19/2013 4:49 PM
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
## 2013-09-19 NOTE — ED Notes (Signed)
MD at bedside. 

## 2013-09-19 NOTE — ED Notes (Signed)
Patient transported to CT 

## 2013-09-19 NOTE — Discharge Instructions (Signed)
Concussion  A concussion, or closed-head injury, is a brain injury caused by a direct blow to the head or by a quick and sudden movement (jolt) of the head or neck. Concussions are usually not life-threatening. Even so, the effects of a concussion can be serious. If you have had a concussion before, you are more likely to experience concussion-like symptoms after a direct blow to the head.   CAUSES  · Direct blow to the head, such as from running into another player during a soccer game, being hit in a fight, or hitting your head on a hard surface.  · A jolt of the head or neck that causes the brain to move back and forth inside the skull, such as in a car crash.  SIGNS AND SYMPTOMS  The signs of a concussion can be hard to notice. Early on, they may be missed by you, family members, and health care providers. You may look fine but act or feel differently.  Symptoms are usually temporary, but they may last for days, weeks, or even longer. Some symptoms may appear right away while others may not show up for hours or days. Every head injury is different. Symptoms include:  · Mild to moderate headaches that will not go away.  · A feeling of pressure inside your head.  · Having more trouble than usual:  ¨ Learning or remembering things you have heard.  ¨ Answering questions.  ¨ Paying attention or concentrating.  ¨ Organizing daily tasks.  ¨ Making decisions and solving problems.  · Slowness in thinking, acting or reacting, speaking, or reading.  · Getting lost or being easily confused.  · Feeling tired all the time or lacking energy (fatigued).  · Feeling drowsy.  · Sleep disturbances.  ¨ Sleeping more than usual.  ¨ Sleeping less than usual.  ¨ Trouble falling asleep.  ¨ Trouble sleeping (insomnia).  · Loss of balance or feeling lightheaded or dizzy.  · Nausea or vomiting.  · Numbness or tingling.  · Increased sensitivity to:  ¨ Sounds.  ¨ Lights.  ¨ Distractions.  · Vision problems or eyes that tire  easily.  · Diminished sense of taste or smell.  · Ringing in the ears.  · Mood changes such as feeling sad or anxious.  · Becoming easily irritated or angry for little or no reason.  · Lack of motivation.  · Seeing or hearing things other people do not see or hear (hallucinations).  DIAGNOSIS  Your health care provider can usually diagnose a concussion based on a description of your injury and symptoms. He or she will ask whether you passed out (lost consciousness) and whether you are having trouble remembering events that happened right before and during your injury.  Your evaluation might include:  · A brain scan to look for signs of injury to the brain. Even if the test shows no injury, you may still have a concussion.  · Blood tests to be sure other problems are not present.  TREATMENT  · Concussions are usually treated in an emergency department, in urgent care, or at a clinic. You may need to stay in the hospital overnight for further treatment.  · Tell your health care provider if you are taking any medicines, including prescription medicines, over-the-counter medicines, and natural remedies. Some medicines, such as blood thinners (anticoagulants) and aspirin, may increase the chance of complications. Also tell your health care provider whether you have had alcohol or are taking illegal drugs. This information   may affect treatment.  · Your health care provider will send you home with important instructions to follow.  · How fast you will recover from a concussion depends on many factors. These factors include how severe your concussion is, what part of your brain was injured, your age, and how healthy you were before the concussion.  · Most people with mild injuries recover fully. Recovery can take time. In general, recovery is slower in older persons. Also, persons who have had a concussion in the past or have other medical problems may find that it takes longer to recover from their current injury.  HOME  CARE INSTRUCTIONS  General Instructions  · Carefully follow the directions your health care provider gave you.  · Only take over-the-counter or prescription medicines for pain, discomfort, or fever as directed by your health care provider.  · Take only those medicines that your health care provider has approved.  · Do not drink alcohol until your health care provider says you are well enough to do so. Alcohol and certain other drugs may slow your recovery and can put you at risk of further injury.  · If it is harder than usual to remember things, write them down.  · If you are easily distracted, try to do one thing at a time. For example, do not try to watch TV while fixing dinner.  · Talk with family members or close friends when making important decisions.  · Keep all follow-up appointments. Repeated evaluation of your symptoms is recommended for your recovery.  · Watch your symptoms and tell others to do the same. Complications sometimes occur after a concussion. Older adults with a brain injury may have a higher risk of serious complications, such as a blood clot on the brain.  · Tell your teachers, school nurse, school counselor, coach, athletic trainer, or work manager about your injury, symptoms, and restrictions. Tell them about what you can or cannot do. They should watch for:  ¨ Increased problems with attention or concentration.  ¨ Increased difficulty remembering or learning new information.  ¨ Increased time needed to complete tasks or assignments.  ¨ Increased irritability or decreased ability to cope with stress.  ¨ Increased symptoms.  · Rest. Rest helps the brain to heal. Make sure you:  ¨ Get plenty of sleep at night. Avoid staying up late at night.  ¨ Keep the same bedtime hours on weekends and weekdays.  ¨ Rest during the day. Take daytime naps or rest breaks when you feel tired.  · Limit activities that require a lot of thought or concentration. These include:  ¨ Doing homework or job-related  work.  ¨ Watching TV.  ¨ Working on the computer.  · Avoid any situation where there is potential for another head injury (football, hockey, soccer, basketball, martial arts, downhill snow sports and horseback riding). Your condition will get worse every time you experience a concussion. You should avoid these activities until you are evaluated by the appropriate follow-up health care providers.  Returning To Your Regular Activities  You will need to return to your normal activities slowly, not all at once. You must give your body and brain enough time for recovery.  · Do not return to sports or other athletic activities until your health care provider tells you it is safe to do so.  · Ask your health care provider when you can drive, ride a bicycle, or operate heavy machinery. Your ability to react may be slower after a   brain injury. Never do these activities if you are dizzy.  · Ask your health care provider about when you can return to work or school.  Preventing Another Concussion  It is very important to avoid another brain injury, especially before you have recovered. In rare cases, another injury can lead to permanent brain damage, brain swelling, or death. The risk of this is greatest during the first 7-10 days after a head injury. Avoid injuries by:  · Wearing a seat belt when riding in a car.  · Drinking alcohol only in moderation.  · Wearing a helmet when biking, skiing, skateboarding, skating, or doing similar activities.  · Avoiding activities that could lead to a second concussion, such as contact or recreational sports, until your health care provider says it is okay.  · Taking safety measures in your home.  ¨ Remove clutter and tripping hazards from floors and stairways.  ¨ Use grab bars in bathrooms and handrails by stairs.  ¨ Place non-slip mats on floors and in bathtubs.  ¨ Improve lighting in dim areas.  SEEK MEDICAL CARE IF:  · You have increased problems paying attention or  concentrating.  · You have increased difficulty remembering or learning new information.  · You need more time to complete tasks or assignments than before.  · You have increased irritability or decreased ability to cope with stress.  · You have more symptoms than before.  Seek medical care if you have any of the following symptoms for more than 2 weeks after your injury:  · Lasting (chronic) headaches.  · Dizziness or balance problems.  · Nausea.  · Vision problems.  · Increased sensitivity to noise or light.  · Depression or mood swings.  · Anxiety or irritability.  · Memory problems.  · Difficulty concentrating or paying attention.  · Sleep problems.  · Feeling tired all the time.  SEEK IMMEDIATE MEDICAL CARE IF:  · You have severe or worsening headaches. These may be a sign of a blood clot in the brain.  · You have weakness (even if only in one hand, leg, or part of the face).  · You have numbness.  · You have decreased coordination.  · You vomit repeatedly.  · You have increased sleepiness.  · One pupil is larger than the other.  · You have convulsions.  · You have slurred speech.  · You have increased confusion. This may be a sign of a blood clot in the brain.  · You have increased restlessness, agitation, or irritability.  · You are unable to recognize people or places.  · You have neck pain.  · It is difficult to wake you up.  · You have unusual behavior changes.  · You lose consciousness.  MAKE SURE YOU:  · Understand these instructions.  · Will watch your condition.  · Will get help right away if you are not doing well or get worse.  Document Released: 03/14/2003 Document Revised: 12/27/2012 Document Reviewed: 07/14/2012  [employer]® Patient Information ©2015 [employer]. This information is not intended to replace advice given to you by your health care provider. Make sure you discuss any questions you have with your health care provider.
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
## 2013-09-19 NOTE — ED Notes (Signed)
Patient transported to X-ray 

## 2014-01-22 ENCOUNTER — Other Ambulatory Visit (HOSPITAL_COMMUNITY): Payer: Self-pay | Admitting: Obstetrics and Gynecology

## 2014-01-22 DIAGNOSIS — Z1231 Encounter for screening mammogram for malignant neoplasm of breast: Secondary | ICD-10-CM

## 2014-01-26 ENCOUNTER — Ambulatory Visit (HOSPITAL_COMMUNITY): Payer: Medicaid Other

## 2014-01-29 ENCOUNTER — Ambulatory Visit (HOSPITAL_COMMUNITY): Payer: Medicaid Other | Attending: Obstetrics and Gynecology

## 2014-02-12 ENCOUNTER — Ambulatory Visit (HOSPITAL_COMMUNITY): Payer: Medicaid Other

## 2014-06-29 ENCOUNTER — Ambulatory Visit (INDEPENDENT_AMBULATORY_CARE_PROVIDER_SITE_OTHER): Payer: Medicaid Other | Admitting: Internal Medicine

## 2014-06-29 ENCOUNTER — Encounter: Payer: Self-pay | Admitting: Internal Medicine

## 2014-06-29 VITALS — BP 118/80 | HR 108 | Ht 62.0 in | Wt 123.2 lb

## 2014-06-29 DIAGNOSIS — R05 Cough: Secondary | ICD-10-CM | POA: Diagnosis not present

## 2014-06-29 DIAGNOSIS — R053 Chronic cough: Secondary | ICD-10-CM | POA: Insufficient documentation

## 2014-06-29 DIAGNOSIS — Z87891 Personal history of nicotine dependence: Secondary | ICD-10-CM | POA: Diagnosis not present

## 2014-06-29 LAB — NITRIC OXIDE: Nitric Oxide: 27

## 2014-06-29 NOTE — Patient Instructions (Addendum)
ICD-9-CM ICD-10-CM   1. Chronic cough 786.2 R05   2. Former smoker, stopped smoking in distant past V15.82 Z87.891     Do sinus CT without contrast in the next week Do high resolution CT scan of the chest without contrast within the next week Do full pulmonary function test  Follow-up - Return in the next 7-10 days to see myself or my nurse practitioner Rikki Spearing to regroup    - Further advised the pending on test results but could include short course prednisone, inhaler therapy, aggressive sinus therapy and/or voice rehab

## 2014-06-29 NOTE — Progress Notes (Signed)
Subjective:    Patient ID: [name], female    DOB: 11-26-1955, 59 y.o.   MRN: [id number] PCP Pamelia Hoit, MD  HPI   IOV 06/29/2014  Chief Complaint  Patient presents with  . Pulmonary Consult    Pt referred by Dr. Benedetto Goad for cough. Pt c/o dry cough x 6 months and hoarseness.    59 year old female referred for chronic cough  Remote 20 pack smoking history quit 30 years ago. Reports insidious onset of chronic cough since January 2016.Marland Kitchen History is from her and review of the outside medical record. Reports that in January 2016 was started on lisinopril for hypertension and around the same time developed cough of insidious onset. She is unclear if the cough predated lisinopril. All along the cough persisted. It was moderate in severity and dry in quality. Approximately 10 days ago primary care physician stopped the lisinopril (chart review seems to confirm it) and since then the cough is worse and is of severe intensity. It is present both day and night but particularly worse at night and she has to wake up from a sleep constantly because of cough. She does not have any wheezing or shortness of breath. There is associated postnasal drainage and a ticklish sensation of the throat particularly in the last 10 days. Cough is associated with some muscle pull in her left flank lower back/lower chest area. RSI cough score is 29 reflects severe multifactorial cough  Cough associated features - Sinus and allergy issues: She has never seen an ENT physician. Denies having had a CT scan of the sinuses. There is a tickle in the throat and there is an occasional gag. She's not had any sinus medications. She denies any sinus allergies. Blood lab 09/19/13 in chart - cbc normal without eosinophilia  - Pulmonary issues: Remote 20 pack smoking history. Does not have any active diagnosis of COPD emphysema pulmonary fibrosis. Chest x-ray March 2015 that I personally visualized images clear. Had chest  x-ray by the primary care physician according to the outside chart that I reviewed and this is reported as clear. This was on 06/14/1998 610. The images not available for me. There is family history of asthma. Exhaled nitric oxide is slightly elevated at 26. She was given an inhaler but she has not decided to use it because she did not want to get "addicted to it"  - Acid reflux: She has significant acid reflux for which she is on Protonix and this does not help her cough.  - Irritable larynx/cough hypersensitivity. She is not aware of this diagnosis. But she is on gabapentin for the last several months because of chronic neuropathic back pain and this has not helped her cough.   - BP: stoppiong lisinopril has not help   Dr Gretta Cool Reflux Symptom Index (> 13-15 suggestive of LPR cough) 0 -> 5  =  none ->severe problem 06/29/2014   Hoarseness of problem with voice 4  Clearing  Of Throat 4  Excess throat mucus or feeling of post nasal drip 5  Difficulty swallowing food, liquid or tablets 0  Cough after eating or lying down 5  Breathing difficulties or choking episodes 4  Troublesome or annoying cough 5  Sensation of something sticking in throat or lump in throat 0  Heartburn, chest pain, indigestion, or stomach acid coming up 2  TOTAL 29    Exhaled nitric oxide today in the office 06/29/2014 - 27 and slightly elevated    has a  past medical history of Hypertension; Hyperlipidemia; Depression; Acid reflux; and Anxiety.   reports that she quit smoking about 31 years ago. Her smoking use included Cigarettes. She has a 20 pack-year smoking history. She has never used smokeless tobacco.  Past Surgical History  Procedure Laterality Date  . Spine surgery    . Abdominal hysterectomy    . Coronary stent placement      Allergies  Allergen Reactions  . Codeine   . Lyrica [Pregabalin]   . Pravastatin   . Topamax [Topiramate]     Immunization History  Administered Date(s) Administered    . Influenza Split 10/05/2013    Family History  Problem Relation Age of Onset  . Heart disease Mother   . Diabetes Mother   . Heart disease Brother      Current outpatient prescriptions:  .  ALPRAZolam (XANAX) 1 MG tablet, Take 0.5-1 mg by mouth 3 (three) times daily as needed., Disp: , Rfl:  .  aspirin 81 MG tablet, Take 81 mg by mouth daily., Disp: , Rfl:  .  Gabapentin Enacarbil (HORIZANT) 600 MG TBCR, Take 1 tablet by mouth daily., Disp: , Rfl:  .  metoprolol tartrate (LOPRESSOR) 25 MG tablet, Take 25 mg by mouth daily. , Disp: , Rfl:  .  Oxycodone HCl 10 MG TABS, Take 1 tablet (10 mg total) by mouth every 6 (six) hours as needed., Disp: 90 tablet, Rfl: 0 .  pantoprazole (PROTONIX) 40 MG tablet, Take 40 mg by mouth daily., Disp: , Rfl:  .  ranitidine (ZANTAC) 150 MG capsule, Take 150 mg by mouth 2 (two) times daily as needed. , Disp: , Rfl:  .  rosuvastatin (CRESTOR) 5 MG tablet, Take 2.5 mg by mouth daily., Disp: , Rfl:  .  sertraline (ZOLOFT) 50 MG tablet, Take 50 mg by mouth daily., Disp: , Rfl:  .  SUMAtriptan (IMITREX) 100 MG tablet, Take 100 mg by mouth every 2 (two) hours as needed for migraine or headache. May repeat in 2 hours if headache persists or recurs., Disp: , Rfl:      Review of Systems  Constitutional: Negative for fever and unexpected weight change.  HENT: Negative for congestion, dental problem, ear pain, nosebleeds, postnasal drip, rhinorrhea, sinus pressure, sneezing, sore throat and trouble swallowing.   Eyes: Negative for redness and itching.  Respiratory: Positive for cough and shortness of breath. Negative for chest tightness and wheezing.   Cardiovascular: Negative for palpitations and leg swelling.  Gastrointestinal: Positive for abdominal pain. Negative for nausea and vomiting.  Genitourinary: Negative for dysuria.  Musculoskeletal: Negative for joint swelling.  Skin: Negative for rash.  Neurological: Negative for headaches.  Hematological: Does  not bruise/bleed easily.  Psychiatric/Behavioral: Negative for dysphoric mood. The patient is not nervous/anxious.        Objective:   Physical Exam  Constitutional: She is oriented to person, place, and time. She appears well-developed and well-nourished. No distress.  HENT:  Head: Normocephalic and atraumatic.  Right Ear: External ear normal.  Left Ear: External ear normal.  Mouth/Throat: Oropharynx is clear and moist. No oropharyngeal exudate.  Eyes: Conjunctivae and EOM are normal. Pupils are equal, round, and reactive to light. Right eye exhibits no discharge. Left eye exhibits no discharge. No scleral icterus.  Neck: Normal range of motion. Neck supple. No JVD present. No tracheal deviation present. No thyromegaly present.  Cardiovascular: Normal rate, regular rhythm, normal heart sounds and intact distal pulses.  Exam reveals no gallop and no friction rub.  No murmur heard. Pulmonary/Chest: Effort normal and breath sounds normal. No respiratory distress. She has no wheezes. She has no rales. She exhibits no tenderness.  Abdominal: Soft. Bowel sounds are normal. She exhibits no distension and no mass. There is no tenderness. There is no rebound and no guarding.  Musculoskeletal: Normal range of motion. She exhibits no edema or tenderness.  Lymphadenopathy:    She has no cervical adenopathy.  Neurological: She is alert and oriented to person, place, and time. She has normal reflexes. No cranial nerve deficit. She exhibits normal muscle tone. Coordination normal.  Skin: Skin is warm and dry. No rash noted. She is not diaphoretic. No erythema. No pallor.  Psychiatric: She has a normal mood and affect. Her behavior is normal. Judgment and thought content normal.  Vitals reviewed.   Filed Vitals:   06/29/14 1553  BP: 118/80  Pulse: 108  Height:  (1.575 m)  Weight: 123 lb 3.2 oz (55.883 kg)  SpO2: 94%         Assessment & Plan:     ICD-9-CM ICD-10-CM   1. Chronic  cough 786.2 R05   2. Former smoker, stopped smoking in distant past V15.82 Z87.891    Her cough is likely multifactorial and sinus, acid reflux, possible obstructive lung disease [based on fact exhaled nitric oxide is slightly elevated] likely involved. In addition given the chronicity she might have cough hypersensitivity syndrome which is reflected in the high RSI cough score; dysregulation of the neural airways in the upper airway.  Plan - - Do sinus CT without contrast  - do high resolution CT scan of the chest to rule out interstitial lung disease and/or COPD   - do full pulmonary function test   - regroup after the above with my nurse practitioner   Follow-up - Return in the next 7-10 days to see myself or my nurse practitioner Rikki Spearing to regroup    - Further advised the pending on test results but could include short course prednisone, inhaler therapy (avoiding methacholine due to CAD), aggressive sinus therapy and/or voice rehab - NOTE: we will avoid methacholine challenge test to extent possible on account of her CAD    Dr. Kalman Shan, M.D., [facility].C.P Pulmonary and Critical Care Medicine Staff Physician [facility] [place] [facility]monary and Critical Care Pager: [phone], If no answer or between  15:00h - 7:00h: call [phone]  06/29/2014 4:34 PM

## 2014-07-03 ENCOUNTER — Telehealth: Payer: Self-pay | Admitting: Internal Medicine

## 2014-07-03 NOTE — Telephone Encounter (Signed)
lmomtcb x1 

## 2014-07-04 NOTE — Telephone Encounter (Signed)
lmtcb for pt.  

## 2014-07-05 ENCOUNTER — Ambulatory Visit (HOSPITAL_COMMUNITY)
Admission: RE | Admit: 2014-07-05 | Discharge: 2014-07-05 | Disposition: A | Discharge status: Home / Self Care | Payer: Medicaid Other | Source: Ambulatory Visit | Attending: Internal Medicine | Admitting: Internal Medicine

## 2014-07-05 ENCOUNTER — Ambulatory Visit (INDEPENDENT_AMBULATORY_CARE_PROVIDER_SITE_OTHER)
Admission: RE | Admit: 2014-07-05 | Discharge: 2014-07-05 | Disposition: A | Discharge status: Home / Self Care | Payer: Medicaid Other | Source: Ambulatory Visit | Attending: Internal Medicine | Admitting: Internal Medicine

## 2014-07-05 ENCOUNTER — Telehealth: Payer: Self-pay | Admitting: Internal Medicine

## 2014-07-05 DIAGNOSIS — R053 Chronic cough: Secondary | ICD-10-CM

## 2014-07-05 DIAGNOSIS — Z87891 Personal history of nicotine dependence: Secondary | ICD-10-CM | POA: Diagnosis not present

## 2014-07-05 DIAGNOSIS — R05 Cough: Secondary | ICD-10-CM

## 2014-07-05 LAB — PULMONARY FUNCTION TEST
DL/VA % pred: 84 %
DL/VA: 3.85 ml/min/mmHg/L
DLCO unc % pred: 78 %
DLCO unc: 16.95 ml/min/mmHg
FEF 25-75 Post: 2.82 L/sec
FEF 25-75 Pre: 1.41 L/sec
FEF2575-%Change-Post: 99 %
FEF2575-%Pred-Post: 124 %
FEF2575-%Pred-Pre: 62 %
FEV1-%Change-Post: 20 %
FEV1-%Pred-Post: 99 %
FEV1-%Pred-Pre: 82 %
FEV1-Post: 2.37 L
FEV1-Pre: 1.96 L
FEV1FVC-%Change-Post: 4 %
FEV1FVC-%Pred-Pre: 93 %
FEV6-%Change-Post: 15 %
FEV6-%Pred-Post: 101 %
FEV6-%Pred-Pre: 88 %
FEV6-Post: 3.03 L
FEV6-Pre: 2.62 L
FEV6FVC-%Change-Post: 0 %
FEV6FVC-%Pred-Post: 102 %
FEV6FVC-%Pred-Pre: 101 %
FVC-%Change-Post: 15 %
FVC-%Pred-Post: 99 %
FVC-%Pred-Pre: 86 %
FVC-Post: 3.09 L
FVC-Pre: 2.68 L
Post FEV1/FVC ratio: 77 %
Post FEV6/FVC ratio: 98 %
Pre FEV1/FVC ratio: 73 %
Pre FEV6/FVC Ratio: 98 %
RV % pred: 120 %
RV: 2.26 L
TLC % pred: 108 %
TLC: 5.14 L

## 2014-07-05 MED ORDER — ALBUTEROL SULFATE (2.5 MG/3ML) 0.083% IN NEBU
2.5000 mg | INHALATION_SOLUTION | Freq: Once | RESPIRATORY_TRACT | Status: AC
  Administered 2014-07-05: 2.5 mg via RESPIRATORY_TRACT

## 2014-07-05 NOTE — Telephone Encounter (Signed)
  LEt [name] Deery know that   0- CT chest - clear lung fields. No cancer. No fibrosis. Good new  - CT sinus - she has sinusitis in all her sinsuses - refer ENT  - Otehrwise - keep up plan of recent OV    Ct Chest High Resolution  07/05/2014   CLINICAL DATA:  Chronic cough, recently became productive. Evaluate for interstitial lung disease.  EXAM: CT CHEST WITHOUT CONTRAST  TECHNIQUE: Multidetector CT imaging of the chest was performed following the standard protocol without intravenous contrast. High resolution imaging of the lungs, as well as inspiratory and expiratory imaging, was performed.  COMPARISON:  None.  FINDINGS: Mediastinum/Nodes: No pathologically enlarged mediastinal or axillary lymph nodes. Hilar regions are difficult to definitively evaluate without IV contrast but appear grossly unremarkable. Three-vessel coronary artery calcification. Heart size normal. No pericardial effusion.  Lungs/Pleura: No subpleural reticulation, traction bronchiectasis/bronchiolectasis, ground-glass, architectural distortion or honeycombing. Probable 2 mm subpleural lymph node along the left major fissure. Scattered linear scarring in the lower lobes and right middle lobe. No air trapping. No pleural fluid. Airway is unremarkable.  Upper abdomen: Visualized portions of the liver, adrenal glands, kidneys, spleen, pancreas, stomach and bowel are grossly unremarkable.  Musculoskeletal: No worrisome lytic or sclerotic lesions.  IMPRESSION: 1. No evidence of interstitial lung disease. No findings to explain the patient's symptoms. 2. Three-vessel coronary artery calcification.   Electronically Signed   By: Leanna BattlesMelinda  Blietz Roach.D.   On: 07/05/2014 14:21   Ct Maxillofacial Ltd Wo Cm  07/05/2014   CLINICAL DATA:  Chronic cough and productive of sputum, previous smoker  EXAM: CT PARANASAL SINUS LIMITED WITHOUT CONTRAST  TECHNIQUE: Non-contiguous multidetector CT images of the paranasal sinuses were obtained in a  single plane without contrast.  COMPARISON:  None.  FINDINGS: Near total opacification of the right maxillary sinus is identified as well as moderate opacification of the right greater than left frontal, ethmoid, and left maxillary sinus. Minimal maxillary wall thickening likely indicates chronicity. Ostiomeatal units are occluded. No osseous destruction. The orbits are grossly unremarkable.  IMPRESSION: Right greater than left pansinusitis, likely with an element of chronicity in the maxillary sinuses.   Electronically Signed   By: Christiana PellantGretchen  Green Roach.D.   On: 07/05/2014 15:08

## 2014-07-05 NOTE — Telephone Encounter (Signed)
lmtcb X3 for pt.  Will close per triage protocol.  

## 2014-07-06 ENCOUNTER — Encounter (HOSPITAL_COMMUNITY): Payer: Medicaid Other

## 2014-07-06 NOTE — Telephone Encounter (Signed)
Called and spoke to pt. Informed her of the results and recs per MR. Referral order placed. Pt requested to push OV date back as she has travels coming up. Appt changed to 7/27 with MR.   Will send to MR as FYI.

## 2014-07-13 ENCOUNTER — Ambulatory Visit: Payer: Medicaid Other | Admitting: Internal Medicine

## 2014-07-25 ENCOUNTER — Ambulatory Visit: Payer: Medicaid Other | Admitting: Adult Health

## 2014-08-01 ENCOUNTER — Ambulatory Visit: Payer: Medicaid Other | Admitting: Internal Medicine

## 2014-09-26 ENCOUNTER — Ambulatory Visit (INDEPENDENT_AMBULATORY_CARE_PROVIDER_SITE_OTHER): Payer: Medicaid Other | Admitting: Internal Medicine

## 2014-09-26 ENCOUNTER — Encounter: Payer: Self-pay | Admitting: Internal Medicine

## 2014-09-26 VITALS — BP 94/60 | HR 62 | Wt 126.0 lb

## 2014-09-26 DIAGNOSIS — J329 Chronic sinusitis, unspecified: Secondary | ICD-10-CM | POA: Diagnosis not present

## 2014-09-26 DIAGNOSIS — R05 Cough: Secondary | ICD-10-CM

## 2014-09-26 DIAGNOSIS — R053 Chronic cough: Secondary | ICD-10-CM

## 2014-09-26 NOTE — Progress Notes (Signed)
Subjective:    Patient ID: [name], female    DOB: [birth year], 59 y.o.   MRN: [id number]  HPI   IOV 06/29/2014  Chief Complaint  Patient presents with  . Pulmonary Consult    Pt referred by Dr. Benedetto Goad for cough. Pt c/o dry cough x 6 months and hoarseness.    59 year old female referred for chronic cough  Remote 20 pack smoking history quit 30 years ago. Reports insidious onset of chronic cough since January 2016.Marland Kitchen History is from her and review of the outside medical record. Reports that in January 2016 was started on lisinopril for hypertension and around the same time developed cough of insidious onset. She is unclear if the cough predated lisinopril. All along the cough persisted. It was moderate in severity and dry in quality. Approximately 10 days ago primary care physician stopped the lisinopril (chart review seems to confirm it) and since then the cough is worse and is of severe intensity. It is present both day and night but particularly worse at night and she has to wake up from a sleep constantly because of cough. She does not have any wheezing or shortness of breath. There is associated postnasal drainage and a ticklish sensation of the throat particularly in the last 10 days. Cough is associated with some muscle pull in her left flank lower back/lower chest area. RSI cough score is 29 reflects severe multifactorial cough  Cough associated features - Sinus and allergy issues: She has never seen an ENT physician. Denies having had a CT scan of the sinuses. There is a tickle in the throat and there is an occasional gag. She's not had any sinus medications. She denies any sinus allergies. Blood lab 09/19/13 in chart - cbc normal without eosinophilia  - Pulmonary issues: Remote 20 pack smoking history. Does not have any active diagnosis of COPD emphysema pulmonary fibrosis. Chest x-ray March 2015 that I personally visualized images clear. Had chest x-ray by the primary care  physician according to the outside chart that I reviewed and this is reported as clear. This was on 06/14/1998 610. The images not available for me. There is family history of asthma. Exhaled nitric oxide is slightly elevated at 26. She was given an inhaler but she has not decided to use it because she did not want to get "addicted to it"  - Acid reflux: She has significant acid reflux for which she is on Protonix and this does not help her cough.  - Irritable larynx/cough hypersensitivity. She is not aware of this diagnosis. But she is on gabapentin for the last several months because of chronic neuropathic back pain and this has not helped her cough.   - BP: stoppiong lisinopril has not help   Dr Gretta Cool Reflux Symptom Index (> 13-15 suggestive of LPR cough) 0 -> 5  =  none ->severe problem 06/29/2014   Hoarseness of problem with voice 4  Clearing  Of Throat 4  Excess throat mucus or feeling of post nasal drip 5  Difficulty swallowing food, liquid or tablets 0  Cough after eating or lying down 5  Breathing difficulties or choking episodes 4  Troublesome or annoying cough 5  Sensation of something sticking in throat or lump in throat 0  Heartburn, chest pain, indigestion, or stomach acid coming up 2  TOTAL 29    Exhaled nitric oxide today in the office 06/29/2014 - 27 and slightly elevated   rec PFT, sinus CT, PFT  OV  09/26/2014  Chief Complaint  Patient presents with  . Follow-up    pt stated that it took along time to get rid of the cough.  no cough at this time.   . Dizziness    having some light headedness at times    Follow-up chronic cough  At last visit we initiated workup for cough. She had pulmonary function tests that I reviewed this is normal. She had CT scan of the chest that did not show any abnormalities. She had CT sinus that showed sinusitis. I referred her to ENT but because her brother was dying from heart disease she took anabiotic from her primary care  physician Pamelia Hoit, MD according to her history. This then improved her cough. Since then cough has resolved. She is feeling well. There are no new acute issues.   Current outpatient prescriptions:  .  ALPRAZolam (XANAX) 1 MG tablet, Take 0.5-1 mg by mouth 3 (three) times daily as needed., Disp: , Rfl:  .  aspirin 81 MG tablet, Take 81 mg by mouth daily., Disp: , Rfl:  .  Gabapentin Enacarbil (HORIZANT) 600 MG TBCR, Take 1 tablet by mouth daily., Disp: , Rfl:  .  metoprolol tartrate (LOPRESSOR) 25 MG tablet, Take 25 mg by mouth daily. , Disp: , Rfl:  .  Oxycodone HCl 10 MG TABS, Take 1 tablet (10 mg total) by mouth every 6 (six) hours as needed., Disp: 90 tablet, Rfl: 0 .  pantoprazole (PROTONIX) 40 MG tablet, Take 40 mg by mouth daily., Disp: , Rfl:  .  ranitidine (ZANTAC) 150 MG capsule, Take 150 mg by mouth 2 (two) times daily as needed. , Disp: , Rfl:  .  sertraline (ZOLOFT) 50 MG tablet, Take 50 mg by mouth daily., Disp: , Rfl:  .  SUMAtriptan (IMITREX) 100 MG tablet, Take 100 mg by mouth every 2 (two) hours as needed for migraine or headache. May repeat in 2 hours if headache persists or recurs., Disp: , Rfl:  .  rosuvastatin (CRESTOR) 5 MG tablet, Take 2.5 mg by mouth daily., Disp: , Rfl:    Review of Systems  Constitutional: Negative for fever and unexpected weight change.  HENT: Negative for congestion, dental problem, ear pain, nosebleeds, postnasal drip, rhinorrhea, sinus pressure, sneezing, sore throat and trouble swallowing.   Eyes: Negative for redness and itching.  Respiratory: Negative for cough, chest tightness, shortness of breath and wheezing.   Cardiovascular: Negative for palpitations and leg swelling.  Gastrointestinal: Negative for nausea and vomiting.  Genitourinary: Negative for dysuria.  Musculoskeletal: Negative for joint swelling.  Skin: Negative for rash.  Neurological: Negative for headaches.  Hematological: Does not bruise/bleed easily.    Psychiatric/Behavioral: Negative for dysphoric mood. The patient is not nervous/anxious.        Objective:   Physical Exam  Constitutional: She is oriented to person, place, and time. She appears well-developed and well-nourished. No distress.  HENT:  Head: Normocephalic and atraumatic.  Right Ear: External ear normal.  Left Ear: External ear normal.  Mouth/Throat: Oropharynx is clear and moist. No oropharyngeal exudate.  Eyes: Conjunctivae and EOM are normal. Pupils are equal, round, and reactive to light. Right eye exhibits no discharge. Left eye exhibits no discharge. No scleral icterus.  Neck: Normal range of motion. Neck supple. No JVD present. No tracheal deviation present. No thyromegaly present.  Cardiovascular: Normal rate, regular rhythm, normal heart sounds and intact distal pulses.  Exam reveals no gallop and no friction rub.   No murmur  heard. Pulmonary/Chest: Effort normal and breath sounds normal. No respiratory distress. She has no wheezes. She has no rales. She exhibits no tenderness.  Abdominal: Soft. Bowel sounds are normal. She exhibits no distension and no mass. There is no tenderness. There is no rebound and no guarding.  Musculoskeletal: Normal range of motion. She exhibits no edema or tenderness.  Lymphadenopathy:    She has no cervical adenopathy.  Neurological: She is alert and oriented to person, place, and time. She has normal reflexes. No cranial nerve deficit. She exhibits normal muscle tone. Coordination normal.  Skin: Skin is warm and dry. No rash noted. She is not diaphoretic. No erythema. No pallor.  Psychiatric: She has a normal mood and affect. Her behavior is normal. Judgment and thought content normal.  Vitals reviewed.   Filed Vitals:   09/26/14 1146  BP: 94/60  Pulse: 62  Weight: 126 lb (57.153 kg)  SpO2: 95%         Assessment & Plan:     ICD-9-CM ICD-10-CM   1. Chronic cough 786.2 R05   2. Chronic sinusitis, unspecified location  473.9 J32.9     Glad cough is resolved PFT and CT chest are normal CT sinus has sinusitis - glad antibiotics with PCP Pamelia Hoit, MD helped cough Refer ENT  Followup  - no active followup with pulmonary (does not qualify for LDCT program for lung cancer scree due to < 30 ppd smoking - has only 20 ppd) - return as needed  (> 50% of this 15 min visit spent in face to face counseling or/and coordination of care)    Dr. Kalman Shan, M.D., [facility].C.P Pulmonary and Critical Care Medicine Staff Physician [facility] [place] Pulmonary and Critical Care Pager: [phone], If no answer or between  15:00h - 7:00h: call [phone]  09/26/2014 12:08 PM

## 2014-09-26 NOTE — Patient Instructions (Addendum)
                 ICD-9-CM ICD-10-CM   1. Chronic cough 786.2 R05   2. Chronic sinusitis, unspecified location 473.9 J32.9     Glad cough is resolved PFT and CT chest are normal CT sinus has sinusitis - glad antibiotics with PCP Pamelia Hoit, MD helped cough Refer ENT  Followup  - no active followup with pulmonary - return as needed

## 2014-12-06 ENCOUNTER — Ambulatory Visit
Admission: RE | Admit: 2014-12-06 | Discharge: 2014-12-06 | Disposition: A | Discharge status: Home / Self Care | Payer: Medicaid Other | Source: Ambulatory Visit | Attending: Obstetrics and Gynecology | Admitting: Obstetrics and Gynecology

## 2014-12-06 DIAGNOSIS — Z1231 Encounter for screening mammogram for malignant neoplasm of breast: Secondary | ICD-10-CM

## 2018-08-11 ENCOUNTER — Institutional Professional Consult (permissible substitution): Payer: Self-pay | Admitting: Internal Medicine

## 2018-08-21 ENCOUNTER — Other Ambulatory Visit: Payer: Self-pay

## 2018-08-21 ENCOUNTER — Emergency Department (HOSPITAL_COMMUNITY): Payer: Medicaid Other

## 2018-08-21 ENCOUNTER — Emergency Department (HOSPITAL_COMMUNITY)
Admission: EM | Admit: 2018-08-21 | Discharge: 2018-08-21 | Disposition: A | Discharge status: Home / Self Care | Payer: Medicaid Other | Attending: Emergency Medicine | Admitting: Emergency Medicine

## 2018-08-21 ENCOUNTER — Encounter (HOSPITAL_COMMUNITY): Payer: Self-pay | Admitting: Emergency Medicine

## 2018-08-21 DIAGNOSIS — U071 COVID-19: Secondary | ICD-10-CM

## 2018-08-21 DIAGNOSIS — Z7982 Long term (current) use of aspirin: Secondary | ICD-10-CM | POA: Diagnosis not present

## 2018-08-21 DIAGNOSIS — Z87891 Personal history of nicotine dependence: Secondary | ICD-10-CM | POA: Diagnosis not present

## 2018-08-21 DIAGNOSIS — G8929 Other chronic pain: Secondary | ICD-10-CM | POA: Diagnosis not present

## 2018-08-21 DIAGNOSIS — I10 Essential (primary) hypertension: Secondary | ICD-10-CM | POA: Insufficient documentation

## 2018-08-21 DIAGNOSIS — R0602 Shortness of breath: Secondary | ICD-10-CM | POA: Insufficient documentation

## 2018-08-21 DIAGNOSIS — Z79899 Other long term (current) drug therapy: Secondary | ICD-10-CM | POA: Insufficient documentation

## 2018-08-21 DIAGNOSIS — Z20828 Contact with and (suspected) exposure to other viral communicable diseases: Secondary | ICD-10-CM | POA: Diagnosis not present

## 2018-08-21 DIAGNOSIS — M549 Dorsalgia, unspecified: Secondary | ICD-10-CM | POA: Insufficient documentation

## 2018-08-21 DIAGNOSIS — Z955 Presence of coronary angioplasty implant and graft: Secondary | ICD-10-CM | POA: Diagnosis not present

## 2018-08-21 LAB — BASIC METABOLIC PANEL
Anion gap: 8 (ref 5–15)
BUN: 12 mg/dL (ref 8–23)
CO2: 24 mmol/L (ref 22–32)
Calcium: 8.8 mg/dL — ABNORMAL LOW (ref 8.9–10.3)
Chloride: 107 mmol/L (ref 98–111)
Creatinine, Ser: 0.96 mg/dL (ref 0.44–1.00)
GFR calc Af Amer: >60 mL/min (ref >60)
GFR calc non Af Amer: >60 mL/min (ref >60)
Glucose, Bld: 125 mg/dL — ABNORMAL HIGH (ref 70–99)
Potassium: 7.2 mmol/L (ref 3.5–5.1)
Sodium: 139 mmol/L (ref 135–145)

## 2018-08-21 LAB — CBC WITH DIFFERENTIAL/PLATELET
Abs Immature Granulocytes: 0.01 10*3/uL (ref 0.00–0.07)
Basophils Absolute: 0 10*3/uL (ref 0.0–0.1)
Basophils Relative: 1 %
Eosinophils Absolute: 0.1 10*3/uL (ref 0.0–0.5)
Eosinophils Relative: 1 %
HCT: 36.7 % (ref 36.0–46.0)
Hemoglobin: 12.1 g/dL (ref 12.0–15.0)
Immature Granulocytes: 0 %
Lymphocytes Relative: 39 %
Lymphs Abs: 1.7 10*3/uL (ref 0.7–4.0)
MCH: 32.4 pg (ref 26.0–34.0)
MCHC: 33 g/dL (ref 30.0–36.0)
MCV: 98.1 fL (ref 80.0–100.0)
Monocytes Absolute: 0.3 10*3/uL (ref 0.1–1.0)
Monocytes Relative: 7 %
Neutro Abs: 2.3 10*3/uL (ref 1.7–7.7)
Neutrophils Relative %: 52 %
Platelets: 234 10*3/uL (ref 150–400)
RBC: 3.74 MIL/uL — ABNORMAL LOW (ref 3.87–5.11)
RDW: 12.2 % (ref 11.5–15.5)
WBC: 4.4 10*3/uL (ref 4.0–10.5)
nRBC: 0 % (ref 0.0–0.2)

## 2018-08-21 LAB — POTASSIUM
Potassium: 6.4 mmol/L (ref 3.5–5.1)
Potassium: 3.9 mmol/L (ref 3.5–5.1)

## 2018-08-21 LAB — TROPONIN I (HIGH SENSITIVITY)
Troponin I (High Sensitivity): 15 ng/L (ref <18)
Troponin I (High Sensitivity): 8 ng/L (ref <18)

## 2018-08-21 MED ORDER — DEXAMETHASONE SODIUM PHOSPHATE 10 MG/ML IJ SOLN
10.0000 mg | Freq: Once | INTRAMUSCULAR | Status: AC
  Administered 2018-08-21: 10 mg via INTRAVENOUS
  Filled 2018-08-21: qty 1

## 2018-08-21 MED ORDER — ONDANSETRON HCL 4 MG/2ML IJ SOLN
4.0000 mg | Freq: Once | INTRAMUSCULAR | Status: AC
  Administered 2018-08-21: 4 mg via INTRAVENOUS
  Filled 2018-08-21: qty 2

## 2018-08-21 MED ORDER — IOHEXOL 350 MG/ML SOLN
75.0000 mL | Freq: Once | INTRAVENOUS | Status: AC | PRN
  Administered 2018-08-21: 75 mL via INTRAVENOUS

## 2018-08-21 MED ORDER — MORPHINE SULFATE (PF) 4 MG/ML IV SOLN
4.0000 mg | Freq: Once | INTRAVENOUS | Status: AC
  Administered 2018-08-21: 4 mg via INTRAVENOUS
  Filled 2018-08-21: qty 1

## 2018-08-21 MED ORDER — SODIUM CHLORIDE 0.9 % IV BOLUS
1000.0000 mL | Freq: Once | INTRAVENOUS | Status: AC
  Administered 2018-08-21: 16:00:00 1000 mL via INTRAVENOUS

## 2018-08-21 MED ORDER — LORAZEPAM 2 MG/ML IJ SOLN
1.0000 mg | Freq: Once | INTRAMUSCULAR | Status: AC
  Administered 2018-08-21: 1 mg via INTRAVENOUS
  Filled 2018-08-21: qty 1

## 2018-08-21 NOTE — ED Provider Notes (Signed)
[facility] EMERGENCY DEPARTMENT Provider Note   CSN: [id number] Arrival date & time: 08/21/18  1346    History   Chief Complaint Chief Complaint  Patient presents with  . COVID +  . Fatigue  . Shortness of Breath  . Chest Pain    HPI [name] is a 63 y.o. female.     Pt presents to the ED today with sob.  She tested + for covid on July 28 and has not felt well since then.  She said she gets sob with any movement and she wants to sleep all day.  She has chronic back pain and lying all day makes her back hurt worse.  She feels like she has no life.     Past Medical History:  Diagnosis Date  . Acid reflux   . Anxiety   . Depression   . Hyperlipidemia   . Hypertension     Patient Active Problem List   Diagnosis Date Noted  . Chronic infection of sinus 09/26/2014  . Chronic cough 06/29/2014  . Former smoker, stopped smoking in distant past 06/29/2014  . Sacroiliitis ([place]) 10/16/2011  . Lumbar post-laminectomy syndrome 09/18/2011  . Thoracic or lumbosacral neuritis or radiculitis, unspecified 09/18/2011  . Depression 09/18/2011    Past Surgical History:  Procedure Laterality Date  . ABDOMINAL HYSTERECTOMY    . CORONARY STENT PLACEMENT    . SPINE SURGERY       OB History   No obstetric history on file.      Home Medications    Prior to Admission medications   Medication Sig Start Date End Date Taking? Authorizing Provider  albuterol (VENTOLIN HFA) 108 (90 Base) MCG/ACT inhaler Inhale 2 puffs into the lungs every 4 (four) hours. 07/26/18  Yes [provider]  ALPRAZolam Duanne Moron) 1 MG tablet Take 0.5-1 mg by mouth 3 (three) times daily as needed for anxiety.    Yes [provider]  aspirin 81 MG tablet Take 81 mg by mouth daily.   Yes [provider]  buPROPion (WELLBUTRIN XL) 300 MG 24 hr tablet Take 300 mg by mouth daily. 09/04/15  Yes [provider]  estradiol (ESTRACE VAGINAL) 0.1 MG/GM vaginal  cream Place 1 application vaginally once a week. 05/28/18  Yes [provider]  furosemide (LASIX) 20 MG tablet Take 20 mg by mouth as needed. swelling 05/28/18  Yes [provider]  Gabapentin Enacarbil ER (HORIZANT) 300 MG TBCR Take 300 mg by mouth 2 (two) times daily.    Yes [provider]  linaclotide (LINZESS) 290 MCG CAPS capsule Take 290 mcg by mouth as needed. 05/28/18  Yes [provider]  losartan (COZAAR) 50 MG tablet Take 50 mg by mouth as needed. 05/28/18  Yes [provider]  metoCLOPramide (REGLAN) 10 MG tablet Take 10 mg by mouth as needed. 05/28/18  Yes [provider]  metoprolol succinate (TOPROL-XL) 25 MG 24 hr tablet Take 25 mg by mouth daily. 06/18/17  Yes [provider]  metoprolol tartrate (LOPRESSOR) 25 MG tablet Take 25 mg by mouth 2 (two) times daily.   Yes [provider]  Oxycodone HCl 10 MG TABS Take 1 tablet (10 mg total) by mouth every 6 (six) hours as needed. 08/02/12  Yes Meredith Staggers, MD  pantoprazole (PROTONIX) 40 MG tablet Take 40 mg by mouth daily.   Yes [provider]  pregabalin (LYRICA) 150 MG capsule Take 150 mg by mouth at bedtime.  03/29/18  Yes [provider]  rosuvastatin (CRESTOR) 5 MG tablet Take 2.5 mg by mouth daily.   Yes [provider]  sertraline (ZOLOFT) 50 MG tablet Take 50 mg by mouth daily.   Yes [provider]  SUMAtriptan (IMITREX) 100 MG tablet Take 100 mg by mouth every 2 (two) hours as needed for migraine or headache. May repeat in 2 hours if headache persists or recurs.   Yes [provider]    Family History Family History  Problem Relation Age of Onset  . Heart disease Mother   . Diabetes Mother   . Heart disease Brother     Social History Social History   Tobacco Use  . Smoking status: Former Smoker    Packs/day: 2.00    Years: 10.00    Pack years: 20.00    Types: Cigarettes    Quit date: 01/06/1983     Years since quitting: 35.6  . Smokeless tobacco: Never Used  Substance Use Topics  . Alcohol use: No    Alcohol/week: 0.0 standard drinks  . Drug use: No     Allergies   Codeine, Lisinopril, Lyrica [pregabalin], Pravastatin, Topamax [topiramate], and Venlafaxine   Review of Systems Review of Systems  Respiratory: Positive for shortness of breath.   Musculoskeletal: Positive for back pain.  All other systems reviewed and are negative.    Physical Exam Updated Vital Signs BP 140/87 (BP Location: Right Arm)   Pulse 69   Temp 97.9 F (36.6 C) (Oral)   Resp (!) 22   Ht 5\' 5"  (1.651 m)   Wt 77.1 kg   LMP  (Exact Date)   SpO2 98%   BMI 28.29 kg/m   Physical Exam Vitals signs and nursing note reviewed.  Constitutional:      Appearance: She is well-developed.  HENT:     Head: Normocephalic and atraumatic.     Mouth/Throat:     Mouth: Mucous membranes are dry.  Eyes:     Extraocular Movements: Extraocular movements intact.     Pupils: Pupils are equal, round, and reactive to light.  Neck:     Musculoskeletal: Normal range of motion and neck supple.  Cardiovascular:     Rate and Rhythm: Normal rate and regular rhythm.  Pulmonary:     Effort: Pulmonary effort is normal.     Breath sounds: Normal breath sounds.  Abdominal:     General: Bowel sounds are normal.     Palpations: Abdomen is soft.  Musculoskeletal: Normal range of motion.  Skin:    General: Skin is warm.     Capillary Refill: Capillary refill takes less than 2 seconds.  Neurological:     General: No focal deficit present.     Mental Status: She is alert and oriented to person, place, and time.  Psychiatric:        Mood and Affect: Mood normal.        Behavior: Behavior normal.      ED Treatments / Results  Labs (all labs ordered are listed, but only abnormal results are displayed) Labs Reviewed  BASIC METABOLIC PANEL  CBC  TROPONIN I (HIGH SENSITIVITY)    EKG None  Radiology Dg Chest  Portable 1 View  Result Date: 08/21/2018 CLINICAL DATA:  Chest pain EXAM: PORTABLE CHEST 1 VIEW COMPARISON:  09/19/2013. FINDINGS: The heart size is mildly enlarged. There is some scarring versus atelectasis at the right lung base. There is no pneumothorax. No large pleural effusion. There is  no acute osseous abnormality. IMPRESSION: Scarring versus atelectasis at the left lung base. An infiltrate is not entirely excluded. Otherwise, no acute cardiopulmonary process. Electronically Signed   By: Katherine Mantlehristopher  Green M.D.   On: 08/21/2018 15:19    Procedures Procedures (including critical care time)  Medications Ordered in ED Medications  sodium chloride 0.9 % bolus 1,000 mL (has no administration in time range)  morphine 4 MG/ML injection 4 mg (has no administration in time range)  ondansetron (ZOFRAN) injection 4 mg (has no administration in time range)  LORazepam (ATIVAN) injection 1 mg (has no administration in time range)  dexamethasone (DECADRON) injection 10 mg (has no administration in time range)     Initial Impression / Assessment and Plan / ED Course  I have reviewed the triage vital signs and the nursing notes.  Pertinent labs & imaging results that were available during my care of the patient were reviewed by me and considered in my medical decision making (see chart for details).    Pt is not hypoxic.  Labs pending.  As sob has continued, I also added on a CT angio for PE.  Pt to be signed out to Dr. Criss AlvineGoldston at shift change.  Final Clinical Impressions(s) / ED Diagnoses   Final diagnoses:  COVID-19    ED Discharge Orders    None       Jacalyn LefevreHaviland, Doraine Schexnider, MD 08/21/18 1531

## 2018-08-21 NOTE — ED Notes (Signed)
Lab says that they did run the sample off the newer tube. I reordered to see if they can have a non hemolyzed sample to run.

## 2018-08-21 NOTE — ED Provider Notes (Addendum)
CT scan without acute PE or other acute pulmonary pathology.  Troponin is negative.  Had hemolysis of potassium but repeat is normal.  At this point, unclear why [name] is been so fatigued but likely related to the COVID-19 diagnosis.  Appears stable for discharge to follow-up with PCP.  8:57 PM Patient is asking for repeat coronavirus testing because [name] has persistently testing positive.  I will order this as an outpatient.  Results for orders placed or performed during the hospital encounter of 08/21/18  Basic metabolic panel  Result Value Ref Range   Sodium 139 135 - 145 mmol/L   Potassium 7.2 (HH) 3.5 - 5.1 mmol/L   Chloride 107 98 - 111 mmol/L   CO2 24 22 - 32 mmol/L   Glucose, Bld 125 (H) 70 - 99 mg/dL   BUN 12 8 - 23 mg/dL   Creatinine, Ser 1.610.96 0.44 - 1.00 mg/dL   Calcium 8.8 (L) 8.9 - 10.3 mg/dL   GFR calc non Af Amer >60 >60 mL/min   GFR calc Af Amer >60 >60 mL/min   Anion gap 8 5 - 15  CBC with Differential  Result Value Ref Range   WBC 4.4 4.0 - 10.5 K/uL   RBC 3.74 (L) 3.87 - 5.11 MIL/uL   Hemoglobin 12.1 12.0 - 15.0 g/dL   HCT 09.636.7 04.536.0 - 40.946.0 %   MCV 98.1 80.0 - 100.0 fL   MCH 32.4 26.0 - 34.0 pg   MCHC 33.0 30.0 - 36.0 g/dL   RDW 81.112.2 91.411.5 - 78.215.5 %   Platelets 234 150 - 400 K/uL   nRBC 0.0 0.0 - 0.2 %   Neutrophils Relative % 52 %   Neutro Abs 2.3 1.7 - 7.7 K/uL   Lymphocytes Relative 39 %   Lymphs Abs 1.7 0.7 - 4.0 K/uL   Monocytes Relative 7 %   Monocytes Absolute 0.3 0.1 - 1.0 K/uL   Eosinophils Relative 1 %   Eosinophils Absolute 0.1 0.0 - 0.5 K/uL   Basophils Relative 1 %   Basophils Absolute 0.0 0.0 - 0.1 K/uL   Immature Granulocytes 0 %   Abs Immature Granulocytes 0.01 0.00 - 0.07 K/uL  Potassium  Result Value Ref Range   Potassium 6.4 (HH) 3.5 - 5.1 mmol/L  Potassium  Result Value Ref Range   Potassium 3.9 3.5 - 5.1 mmol/L  Troponin I (High Sensitivity)  Result Value Ref Range   Troponin I (High Sensitivity) 15 <18 ng/L  Troponin I (High  Sensitivity)  Result Value Ref Range   Troponin I (High Sensitivity) 8 <18 ng/L   Ct Angio Chest Pe W And/or Wo Contrast  Result Date: 08/21/2018 CLINICAL DATA:  PE suspected, high pretest prob. Chest pain, shortness of breath, fatigue. COVID-19 positive 07/17/2018. EXAM: CT ANGIOGRAPHY CHEST WITH CONTRAST TECHNIQUE: Multidetector CT imaging of the chest was performed using the standard protocol during bolus administration of intravenous contrast. Multiplanar CT image reconstructions and MIPs were obtained to evaluate the vascular anatomy. CONTRAST:  75mL OMNIPAQUE IOHEXOL 350 MG/ML SOLN COMPARISON:  Chest CT 07/05/2014, radiograph earlier this day. FINDINGS: Cardiovascular: There are no filling defects within the pulmonary arteries to suggest pulmonary embolus. Mild aortic atherosclerosis. No aortic dissection or aneurysm. Coronary artery calcification versus stent. Upper normal heart size. No pericardial effusion. Mediastinum/Nodes: No enlarged mediastinal or hilar lymph nodes. The esophagus is decompressed. No visualized thyroid nodule. Lungs/Pleura: Linear opacities in the dependent lower lobes consistent with atelectasis. No focal consolidation or findings to suggest COVID-19  pneumonia. No pulmonary edema or pleural fluid. Trachea and mainstem bronchi are patent. No pulmonary mass. Upper Abdomen: No acute abnormality. Musculoskeletal: There are no acute or suspicious osseous abnormalities. Review of the MIP images confirms the above findings. IMPRESSION: 1. No pulmonary embolus. 2. Linear lower lobe opacities most consistent with atelectasis. Otherwise no acute findings. 3. Coronary artery calcifications/stents. Aortic Atherosclerosis (ICD10-I70.0). Electronically Signed   By: Keith Rake M.D.   On: 08/21/2018 20:13   Dg Chest Portable 1 View  Result Date: 08/21/2018 CLINICAL DATA:  Chest pain EXAM: PORTABLE CHEST 1 VIEW COMPARISON:  09/19/2013. FINDINGS: The heart size is mildly enlarged. There  is some scarring versus atelectasis at the right lung base. There is no pneumothorax. No large pleural effusion. There is no acute osseous abnormality. IMPRESSION: Scarring versus atelectasis at the left lung base. An infiltrate is not entirely excluded. Otherwise, no acute cardiopulmonary process. Electronically Signed   By: Constance Holster M.D.   On: 08/21/2018 15:19    LURIE MULLANE was evaluated in Emergency Department on 08/21/2018 for the symptoms described in the history of present illness. [name] was evaluated in the context of the global COVID-19 pandemic, which necessitated consideration that the patient might be at risk for infection with the SARS-CoV-2 virus that causes COVID-19. Institutional protocols and algorithms that pertain to the evaluation of patients at risk for COVID-19 are in a state of rapid change based on information released by regulatory bodies including the CDC and federal and state organizations. These policies and algorithms were followed during the patient's care in the ED.    Sherwood Gambler, MD 08/21/18 2051    Sherwood Gambler, MD 08/21/18 [phone]

## 2018-08-21 NOTE — ED Notes (Signed)
Called CT to inform them pt is ready for CTA. Given covid hx she will go to scanner 3 when available.

## 2018-08-21 NOTE — ED Notes (Signed)
Called lab to follow up on k+. Placed on hold for 6 minutes and again for 3 plus minutes. The lab appears to be using the original sample sent to run labs rather than the second set of tubes sent.

## 2018-08-21 NOTE — ED Notes (Signed)
Pt is noted to have chronic back pain. Pt is emotional, she has had deaths in family in the recent past and has been sick with covid for the last few months.

## 2018-08-21 NOTE — ED Notes (Signed)
Pt reports testing COVID+ on 6/12 and then again last month for a total of 2 COVID+ tests. Pt reports 2 months of chest pain, SOB, fatigue, and utterly feeling horrible. Pt reports taking all necessary precautions and sill contracted the virus.

## 2018-08-21 NOTE — ED Notes (Signed)
Redraw and reorder for labs sent.

## 2018-08-21 NOTE — ED Notes (Signed)
Patient transported to CT 

## 2018-08-21 NOTE — ED Notes (Signed)
Patient verbalizes understanding of discharge instructions. Opportunity for questioning and answers were provided. Armband removed by staff, pt discharged from ED. Pt educated on the need for counseling to discuss losses and on going sadness.

## 2018-08-21 NOTE — ED Notes (Addendum)
Working with lab and phlebotomy to get K+ ran on new sample sent by RN earlier. Critical K+ is likely inaccurate.

## 2018-08-22 NOTE — ED Notes (Signed)
Wasted 0.5 mL Ativan with Charge RN Woody after pt discharged.

## 2018-08-23 LAB — NOVEL CORONAVIRUS, NAA (HOSP ORDER, SEND-OUT TO REF LAB; TAT 18-24 HRS): SARS-CoV-2, NAA: NOT DETECTED

## 2018-08-24 ENCOUNTER — Telehealth: Payer: Self-pay | Admitting: Family Medicine

## 2018-08-24 NOTE — Telephone Encounter (Signed)
Negative COVID results given. Patient results "NOT Detected." Caller expressed understanding. ° °

## 2019-10-18 IMAGING — CT CT ANGIOGRAPHY CHEST
2 of 6 series · 18 of 46 positions shown · IV contrast (omnipaque)
Comparison: Chest CT 07/05/2014, radiograph earlier this day.

CLINICAL DATA: PE suspected, high pretest prob. Chest pain,
shortness of breath, fatigue. [id number] positive 07/17/2018.

EXAM:
CT ANGIOGRAPHY CHEST WITH CONTRAST
TECHNIQUE: Multidetector CT imaging of the chest was performed using the
standard protocol during bolus administration of intravenous
contrast. Multiplanar CT image reconstructions and MIPs were
obtained to evaluate the vascular anatomy.
CONTRAST:  75mL OMNIPAQUE IOHEXOL 350 MG/ML SOLN

[Series 6: thins · axial · 0.73mm/px · z∈[+1134,+1365]mm · 15 of 254 slices shown]
[im 12/254  lung]
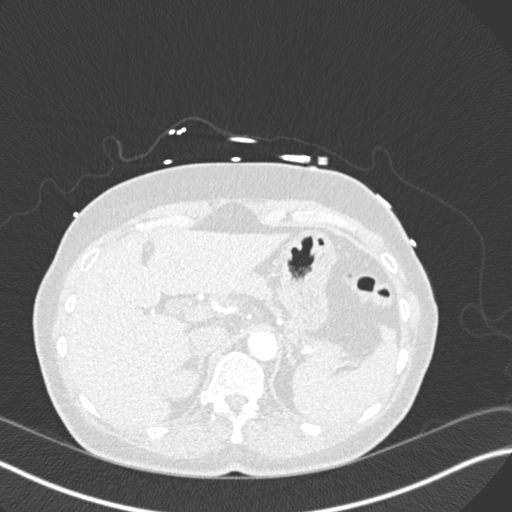
[im 34/254  soft-tissue]
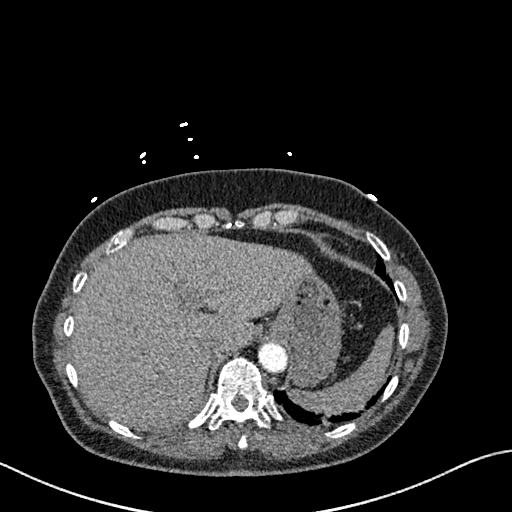
[im 45/254  lung]
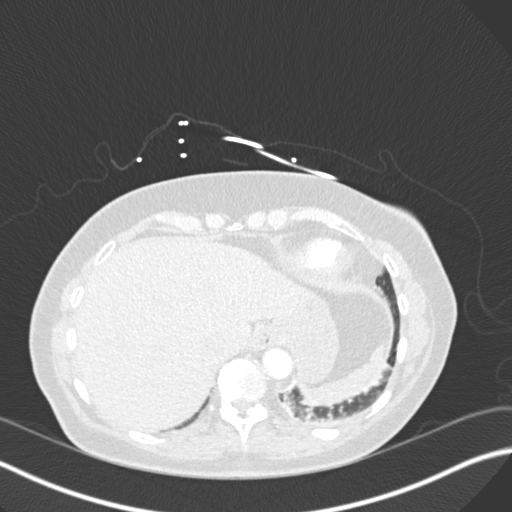
[im 67/254  soft-tissue]
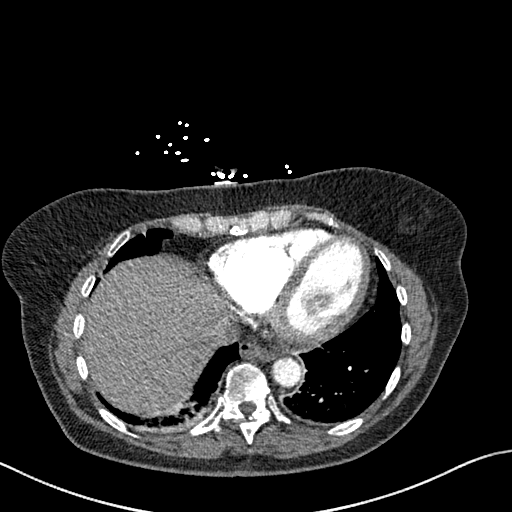
[im 78/254  lung]
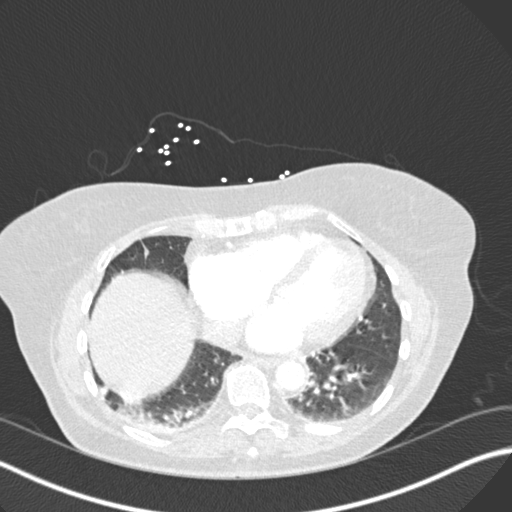
[im 100/254  soft-tissue]
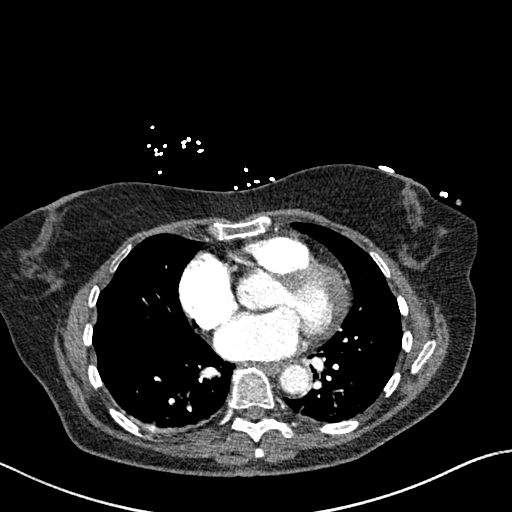
[im 111/254  lung]
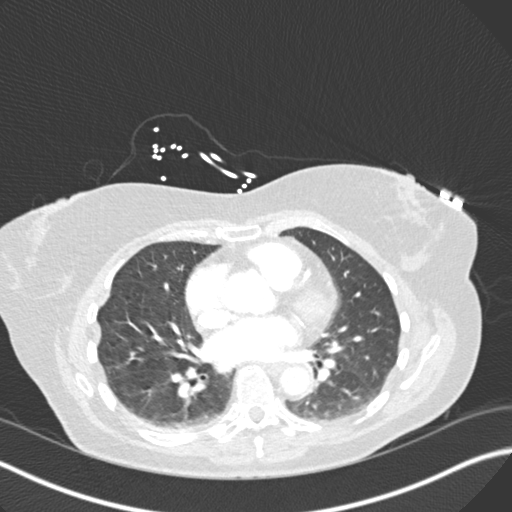
[im 133/254  soft-tissue]
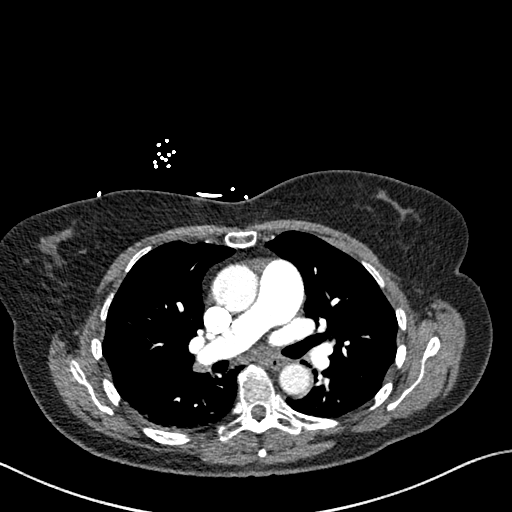
[im 144/254  lung]
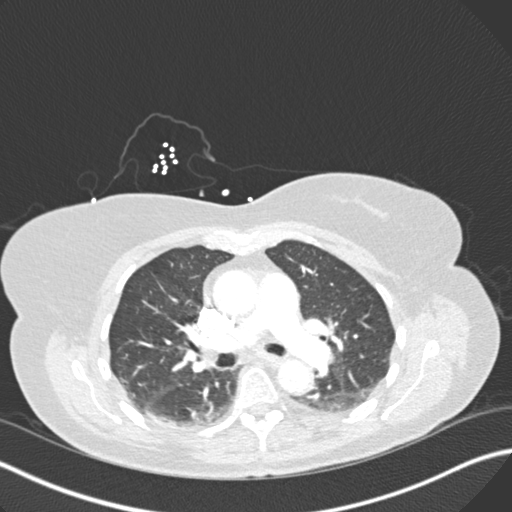
[im 155/254  soft-tissue]
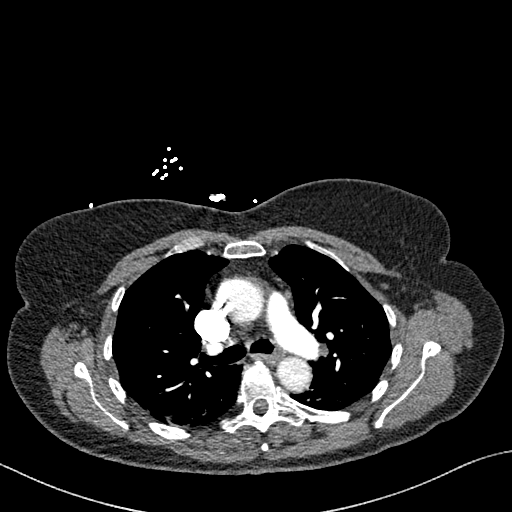
[im 177/254  lung]
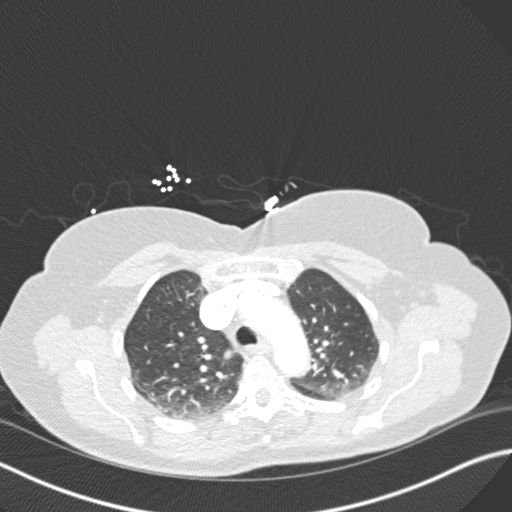
[im 188/254  soft-tissue]
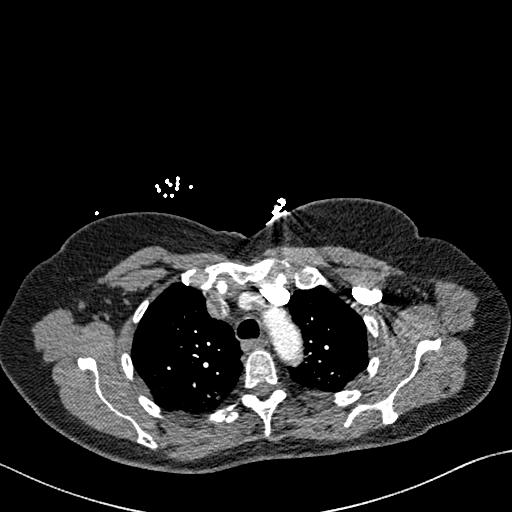
[im 210/254  lung]
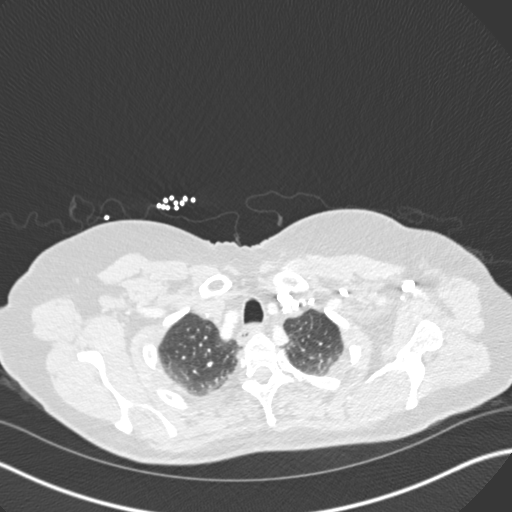
[im 221/254  soft-tissue]
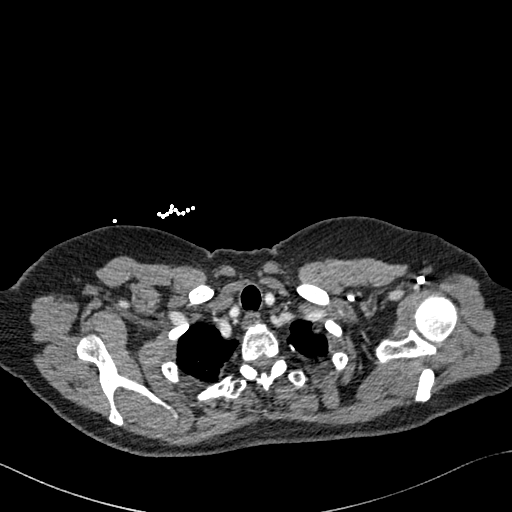
[im 243/254  lung]
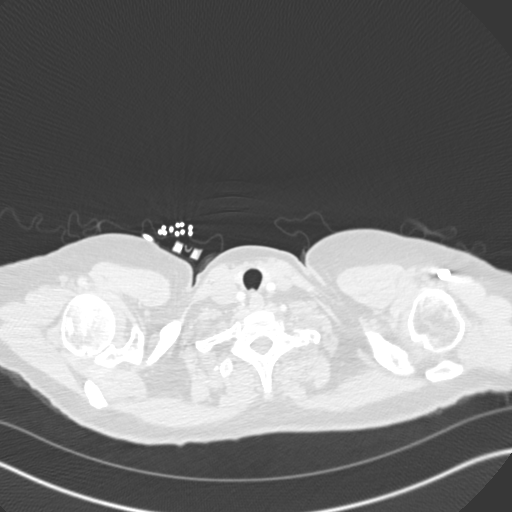

[Series 8: coronal mpr · coronal · 0.52mm/px · 3 of 115 slices shown]
[im 29/115  soft-tissue]
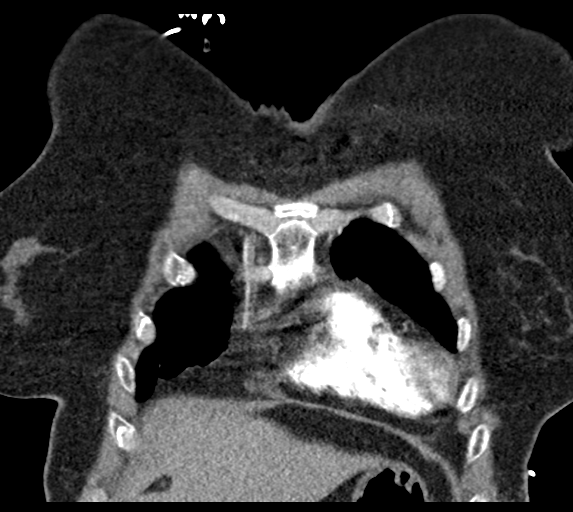
[im 58/115  soft-tissue]
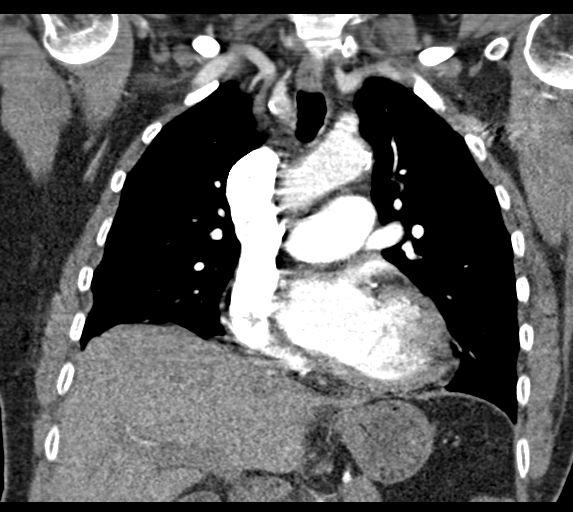
[im 86/115  soft-tissue]
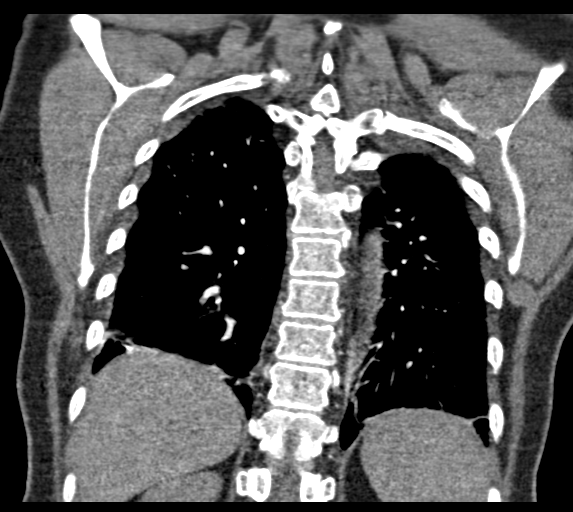

[18 of 46 positions shown; findings below may reference images not displayed]

FINDINGS: Cardiovascular: There are no filling defects within the pulmonary
arteries to suggest pulmonary embolus. Mild aortic atherosclerosis.
No aortic dissection or aneurysm. Coronary artery calcification
versus stent. Upper normal heart size. No pericardial effusion.

Mediastinum/Nodes: No enlarged mediastinal or hilar lymph nodes. The
esophagus is decompressed. No visualized thyroid nodule.

Lungs/Pleura: Linear opacities in the dependent lower lobes
consistent with atelectasis. No focal consolidation or findings to
suggest C0LQW-I7 pneumonia. No pulmonary edema or pleural fluid.
Trachea and mainstem bronchi are patent. No pulmonary mass.

Upper Abdomen: No acute abnormality.

Musculoskeletal: There are no acute or suspicious osseous
abnormalities.

Review of the MIP images confirms the above findings.
IMPRESSION: 1. No pulmonary embolus.
2. Linear lower lobe opacities most consistent with atelectasis.
Otherwise no acute findings.
3. Coronary artery calcifications/stents.

Aortic Atherosclerosis ([id number]).

## 2021-09-03 ENCOUNTER — Other Ambulatory Visit: Payer: Self-pay | Admitting: Family Medicine

## 2021-09-03 DIAGNOSIS — Z1231 Encounter for screening mammogram for malignant neoplasm of breast: Secondary | ICD-10-CM

## 2021-09-04 ENCOUNTER — Ambulatory Visit
Admission: RE | Admit: 2021-09-04 | Discharge: 2021-09-04 | Disposition: A | Discharge status: Home / Self Care | Payer: Medicare HMO | Source: Ambulatory Visit | Attending: Family Medicine | Admitting: Family Medicine

## 2021-09-04 DIAGNOSIS — Z1231 Encounter for screening mammogram for malignant neoplasm of breast: Secondary | ICD-10-CM

## 2021-09-09 ENCOUNTER — Other Ambulatory Visit: Payer: Self-pay | Admitting: Family Medicine

## 2021-09-09 DIAGNOSIS — R928 Other abnormal and inconclusive findings on diagnostic imaging of breast: Secondary | ICD-10-CM

## 2021-09-18 ENCOUNTER — Ambulatory Visit
Admission: RE | Admit: 2021-09-18 | Discharge: 2021-09-18 | Disposition: A | Discharge status: Home / Self Care | Payer: Medicare HMO | Source: Ambulatory Visit | Attending: Family Medicine | Admitting: Family Medicine

## 2021-09-18 DIAGNOSIS — R928 Other abnormal and inconclusive findings on diagnostic imaging of breast: Secondary | ICD-10-CM

## 2023-04-12 ENCOUNTER — Other Ambulatory Visit: Payer: Self-pay | Admitting: Family Medicine

## 2023-04-12 DIAGNOSIS — Z1231 Encounter for screening mammogram for malignant neoplasm of breast: Secondary | ICD-10-CM

## 2023-06-03 ENCOUNTER — Ambulatory Visit

## 2023-06-08 ENCOUNTER — Ambulatory Visit

## 2023-07-01 ENCOUNTER — Ambulatory Visit
Admission: RE | Admit: 2023-07-01 | Discharge: 2023-07-01 | Disposition: A | Discharge status: Home / Self Care | Source: Ambulatory Visit | Attending: Family Medicine | Admitting: Family Medicine

## 2023-07-01 DIAGNOSIS — Z1231 Encounter for screening mammogram for malignant neoplasm of breast: Secondary | ICD-10-CM

## 2023-07-16 ENCOUNTER — Other Ambulatory Visit: Payer: Self-pay | Admitting: Neurosurgery

## 2023-07-16 DIAGNOSIS — G992 Myelopathy in diseases classified elsewhere: Secondary | ICD-10-CM

## 2023-07-16 DIAGNOSIS — M546 Pain in thoracic spine: Secondary | ICD-10-CM

## 2023-07-19 ENCOUNTER — Encounter: Payer: Self-pay | Admitting: Neurosurgery

## 2023-07-23 ENCOUNTER — Other Ambulatory Visit

## 2023-07-26 ENCOUNTER — Ambulatory Visit
Admission: RE | Admit: 2023-07-26 | Discharge: 2023-07-26 | Disposition: A | Discharge status: Home / Self Care | Source: Ambulatory Visit | Attending: Neurosurgery | Admitting: Neurosurgery

## 2023-07-26 DIAGNOSIS — M546 Pain in thoracic spine: Secondary | ICD-10-CM

## 2023-07-26 DIAGNOSIS — G992 Myelopathy in diseases classified elsewhere: Secondary | ICD-10-CM

## 2023-08-09 ENCOUNTER — Other Ambulatory Visit: Payer: Self-pay | Admitting: Neurosurgery

## 2023-08-31 ENCOUNTER — Encounter (HOSPITAL_COMMUNITY): Payer: Self-pay

## 2023-08-31 NOTE — Progress Notes (Signed)
 Surgical Instructions   Your procedure is scheduled on September 07, 2023. Report to [facility] Main Entrance A at 9:30 A.M., then check in with the Admitting office. Any questions or running late day of surgery: call [phone]  Questions prior to your surgery date: call [phone], Monday-Friday, 8am-4pm. If you experience any cold or flu symptoms such as cough, fever, chills, shortness of breath, etc. between now and your scheduled surgery, please notify us  at the above number.     Remember:  Do not eat or drink after midnight the night before your surgery    Take these medicines the morning of surgery with A SIP OF WATER: buPROPion (WELLBUTRIN XL)  Gabapentin  Enacarbil ER (HORIZANT)  metoprolol succinate (TOPROL-XL)  pantoprazole (PROTONIX)  rosuvastatin (CRESTOR)  sertraline (ZOLOFT)    May take these medicines IF NEEDED: albuterol  (VENTOLIN  HFA) inhaler - please bring inhaler with you morning of surgery ALPRAZolam (XANAX)  linaclotide (LINZESS)  metoCLOPramide (REGLAN)  nitroGLYCERIN (NITROSTAT) - if dose taken prior to surgery, please call [phone] Oxycodone  HCl   SUMAtriptan (IMITREX)    Follow your surgeon's instructions on when to stop Aspirin.  If no instructions were given by your surgeon then you will need to call the office to get those instructions.     One week prior to surgery, STOP taking any Aleve, Naproxen, Ibuprofen, Motrin, Advil, Goody's, BC's, all herbal medications, fish oil, and non-prescription vitamins.                     Do NOT Smoke (Tobacco/Vaping) for 24 hours prior to your procedure.  If you use a CPAP at night, you may bring your mask/headgear for your overnight stay.   You will be asked to remove any contacts, glasses, piercing's, hearing aid's, dentures/partials prior to surgery. Please bring cases for these items if needed.    Patients discharged the day of surgery will not be allowed to drive home, and someone needs to stay  with them for 24 hours.  SURGICAL WAITING ROOM VISITATION Patients may have no more than 2 support people in the waiting area - these visitors may rotate.   Pre-op nurse will coordinate an appropriate time for 1 ADULT support person, who may not rotate, to accompany patient in pre-op.  Children under the age of 63 must have an adult with them who is not the patient and must remain in the main waiting area with an adult.  If the patient needs to stay at the hospital during part of their recovery, the visitor guidelines for inpatient rooms apply.  Please refer to the [facility] website for the visitor guidelines for any additional information.   If you received a COVID test during your pre-op visit  it is requested that you wear a mask when out in public, stay away from anyone that may not be feeling well and notify your surgeon if you develop symptoms. If you have been in contact with anyone that has tested positive in the last 10 days please notify you surgeon.      Pre-operative 5 CHG Bathing Instructions   You can play a key role in reducing the risk of infection after surgery. Your skin needs to be as free of germs as possible. You can reduce the number of germs on your skin by washing with CHG (chlorhexidine  gluconate) soap before surgery. CHG is an antiseptic soap that kills germs and continues to kill germs even after washing.   DO NOT use if you  have an allergy to chlorhexidine /CHG or antibacterial soaps. If your skin becomes reddened or irritated, stop using the CHG and notify one of our RNs at [phone].   Please shower with the CHG soap starting 4 days before surgery using the following schedule:     Please keep in mind the following:  DO NOT shave, including legs and underarms, starting the day of your first shower.   You may shave your face at any point before/day of surgery.  Place clean sheets on your bed the day you start using CHG soap. Use a clean washcloth (not  used since being washed) for each shower. DO NOT sleep with pets once you start using the CHG.   CHG Shower Instructions:  Wash your face and private area with normal soap. If you choose to wash your hair, wash first with your normal shampoo.  After you use shampoo/soap, rinse your hair and body thoroughly to remove shampoo/soap residue.  Turn the water OFF and apply about 3 tablespoons (45 ml) of CHG soap to a CLEAN washcloth.  Apply CHG soap ONLY FROM YOUR NECK DOWN TO YOUR TOES (washing for 3-5 minutes)  DO NOT use CHG soap on face, private areas, open wounds, or sores.  Pay special attention to the area where your surgery is being performed.  If you are having back surgery, having someone wash your back for you may be helpful. Wait 2 minutes after CHG soap is applied, then you may rinse off the CHG soap.  Pat dry with a clean towel  Put on clean clothes/pajamas   If you choose to wear lotion, please use ONLY the CHG-compatible lotions that are listed below.  Additional instructions for the day of surgery: DO NOT APPLY any lotions, deodorants, cologne, or perfumes.   Do not bring valuables to the hospital. [facility] is not responsible for any belongings/valuables. Do not wear nail polish, gel polish, artificial nails, or any other type of covering on natural nails (fingers and toes) Do not wear jewelry or makeup Put on clean/comfortable clothes.  Please brush your teeth.  Ask your nurse before applying any prescription medications to the skin.     CHG Compatible Lotions   Aveeno Moisturizing lotion  Cetaphil Moisturizing Cream  Cetaphil Moisturizing Lotion  Clairol Herbal Essence Moisturizing Lotion, Dry Skin  Clairol Herbal Essence Moisturizing Lotion, Extra Dry Skin  Clairol Herbal Essence Moisturizing Lotion, Normal Skin  Curel Age Defying Therapeutic Moisturizing Lotion with Alpha Hydroxy  Curel Extreme Care Body Lotion  Curel Soothing Hands Moisturizing Hand Lotion   Curel Therapeutic Moisturizing Cream, Fragrance-Free  Curel Therapeutic Moisturizing Lotion, Fragrance-Free  Curel Therapeutic Moisturizing Lotion, Original Formula  Eucerin Daily Replenishing Lotion  Eucerin Dry Skin Therapy Plus Alpha Hydroxy Crme  Eucerin Dry Skin Therapy Plus Alpha Hydroxy Lotion  Eucerin Original Crme  Eucerin Original Lotion  Eucerin Plus Crme Eucerin Plus Lotion  Eucerin TriLipid Replenishing Lotion  Keri Anti-Bacterial Hand Lotion  Keri Deep Conditioning Original Lotion Dry Skin Formula Softly Scented  Keri Deep Conditioning Original Lotion, Fragrance Free Sensitive Skin Formula  Keri Lotion Fast Absorbing Fragrance Free Sensitive Skin Formula  Keri Lotion Fast Absorbing Softly Scented Dry Skin Formula  Keri Original Lotion  Keri Skin Renewal Lotion Keri Silky Smooth Lotion  Keri Silky Smooth Sensitive Skin Lotion  Nivea Body Creamy Conditioning Oil  Nivea Body Extra Enriched Teacher, adult education Moisturizing Lotion Nivea Crme  Nivea Skin Firming Lotion  NutraDerm  30 Skin Lotion  NutraDerm Skin Lotion  NutraDerm Therapeutic Skin Cream  NutraDerm Therapeutic Skin Lotion  ProShield Protective Hand Cream  Provon moisturizing lotion  Please read over the following fact sheets that you were given.

## 2023-09-01 ENCOUNTER — Encounter (HOSPITAL_COMMUNITY): Payer: Self-pay

## 2023-09-01 ENCOUNTER — Encounter (HOSPITAL_COMMUNITY)
Admission: RE | Admit: 2023-09-01 | Discharge: 2023-09-01 | Disposition: A | Discharge status: Home / Self Care | Source: Ambulatory Visit | Attending: Neurosurgery | Admitting: Neurosurgery

## 2023-09-01 ENCOUNTER — Other Ambulatory Visit: Payer: Self-pay

## 2023-09-01 VITALS — BP 109/73 | HR 57 | Temp 97.9°F | Resp 17 | Ht 62.0 in | Wt 140.4 lb

## 2023-09-01 DIAGNOSIS — I252 Old myocardial infarction: Secondary | ICD-10-CM | POA: Diagnosis not present

## 2023-09-01 DIAGNOSIS — F419 Anxiety disorder, unspecified: Secondary | ICD-10-CM | POA: Insufficient documentation

## 2023-09-01 DIAGNOSIS — I447 Left bundle-branch block, unspecified: Secondary | ICD-10-CM | POA: Insufficient documentation

## 2023-09-01 DIAGNOSIS — I2510 Atherosclerotic heart disease of native coronary artery without angina pectoris: Secondary | ICD-10-CM | POA: Diagnosis not present

## 2023-09-01 DIAGNOSIS — M797 Fibromyalgia: Secondary | ICD-10-CM | POA: Diagnosis not present

## 2023-09-01 DIAGNOSIS — Z01812 Encounter for preprocedural laboratory examination: Secondary | ICD-10-CM | POA: Diagnosis present

## 2023-09-01 DIAGNOSIS — I10 Essential (primary) hypertension: Secondary | ICD-10-CM | POA: Diagnosis not present

## 2023-09-01 DIAGNOSIS — E785 Hyperlipidemia, unspecified: Secondary | ICD-10-CM | POA: Diagnosis not present

## 2023-09-01 DIAGNOSIS — M4802 Spinal stenosis, cervical region: Secondary | ICD-10-CM | POA: Diagnosis not present

## 2023-09-01 DIAGNOSIS — M4712 Other spondylosis with myelopathy, cervical region: Secondary | ICD-10-CM | POA: Diagnosis not present

## 2023-09-01 DIAGNOSIS — K219 Gastro-esophageal reflux disease without esophagitis: Secondary | ICD-10-CM | POA: Insufficient documentation

## 2023-09-01 DIAGNOSIS — Z01818 Encounter for other preprocedural examination: Secondary | ICD-10-CM

## 2023-09-01 HISTORY — DX: Fibromyalgia: M79.7

## 2023-09-01 HISTORY — DX: Acute myocardial infarction, unspecified: I21.9

## 2023-09-01 HISTORY — DX: Atherosclerotic heart disease of native coronary artery without angina pectoris: I25.10

## 2023-09-01 HISTORY — DX: Cardiac arrhythmia, unspecified: I49.9

## 2023-09-01 HISTORY — DX: Other complications of anesthesia, initial encounter: T88.59XA

## 2023-09-01 LAB — CBC
HCT: 37.2 % (ref 36.0–46.0)
Hemoglobin: 12.3 g/dL (ref 12.0–15.0)
MCH: 32.6 pg (ref 26.0–34.0)
MCHC: 33.1 g/dL (ref 30.0–36.0)
MCV: 98.7 fL (ref 80.0–100.0)
Platelets: 199 K/uL (ref 150–400)
RBC: 3.77 MIL/uL — ABNORMAL LOW (ref 3.87–5.11)
RDW: 11.4 % — ABNORMAL LOW (ref 11.5–15.5)
WBC: 4.3 K/uL (ref 4.0–10.5)
nRBC: 0 % (ref 0.0–0.2)

## 2023-09-01 LAB — TYPE AND SCREEN
ABO/RH(D): A POS
Antibody Screen: NEGATIVE

## 2023-09-01 LAB — BASIC METABOLIC PANEL WITH GFR
Anion gap: 7 (ref 5–15)
BUN: 8 mg/dL (ref 8–23)
CO2: 26 mmol/L (ref 22–32)
Calcium: 9.2 mg/dL (ref 8.9–10.3)
Chloride: 107 mmol/L (ref 98–111)
Creatinine, Ser: 0.94 mg/dL (ref 0.44–1.00)
GFR, Estimated: >60 mL/min (ref >60)
Glucose, Bld: 101 mg/dL — ABNORMAL HIGH (ref 70–99)
Potassium: 4.1 mmol/L (ref 3.5–5.1)
Sodium: 140 mmol/L (ref 135–145)

## 2023-09-01 LAB — SURGICAL PCR SCREEN
MRSA, PCR: NEGATIVE
Staphylococcus aureus: NEGATIVE

## 2023-09-01 NOTE — Progress Notes (Signed)
 Surgical Instructions   Your procedure is scheduled on September 07, 2023. Report to [facility] Main Entrance A at 9:30 A.M., then check in with the Admitting office. Any questions or running late day of surgery: call [phone]  Questions prior to your surgery date: call [phone], Monday-Friday, 8am-4pm. If you experience any cold or flu symptoms such as cough, fever, chills, shortness of breath, etc. between now and your scheduled surgery, please notify us  at the above number.     Remember:  Do not eat or drink after midnight the night before your surgery    Take these medicines the morning of surgery with A SIP OF WATER: metoprolol succinate (TOPROL-XL)  pantoprazole (PROTONIX)   May take these medicines IF NEEDED:  albuterol  (VENTOLIN  HFA) inhaler - please bring inhaler with you morning of surgery ALPRAZolam (XANAX)  linaclotide (LINZESS)  metoCLOPramide (REGLAN)  nitroGLYCERIN (NITROSTAT) - if dose taken prior to surgery, please call [phone] Oxycodone  HCl     Follow your surgeon's instructions on when to stop Aspirin.  If no instructions were given by your surgeon then you will need to call the office to get those instructions.     One week prior to surgery, STOP taking any Aleve, Naproxen, Ibuprofen, Motrin, Advil, Goody's, BC's, all herbal medications, fish oil, and non-prescription vitamins.                     Do NOT Smoke (Tobacco/Vaping) for 24 hours prior to your procedure.  If you use a CPAP at night, you may bring your mask/headgear for your overnight stay.   You will be asked to remove any contacts, glasses, piercing's, hearing aid's, dentures/partials prior to surgery. Please bring cases for these items if needed.    Patients discharged the day of surgery will not be allowed to drive home, and someone needs to stay with them for 24 hours.  SURGICAL WAITING ROOM VISITATION Patients may have no more than 2 support people in the waiting area -  these visitors may rotate.   Pre-op nurse will coordinate an appropriate time for 1 ADULT support person, who may not rotate, to accompany patient in pre-op.  Children under the age of 59 must have an adult with them who is not the patient and must remain in the main waiting area with an adult.  If the patient needs to stay at the hospital during part of their recovery, the visitor guidelines for inpatient rooms apply.  Please refer to the [facility] website for the visitor guidelines for any additional information.   If you received a COVID test during your pre-op visit  it is requested that you wear a mask when out in public, stay away from anyone that may not be feeling well and notify your surgeon if you develop symptoms. If you have been in contact with anyone that has tested positive in the last 10 days please notify you surgeon.      Pre-operative 5 CHG Bathing Instructions   You can play a key role in reducing the risk of infection after surgery. Your skin needs to be as free of germs as possible. You can reduce the number of germs on your skin by washing with CHG (chlorhexidine  gluconate) soap before surgery. CHG is an antiseptic soap that kills germs and continues to kill germs even after washing.   DO NOT use if you have an allergy to chlorhexidine /CHG or antibacterial soaps. If your skin becomes reddened or irritated, stop using the  CHG and notify one of our RNs at [phone].   Please shower with the CHG soap starting 4 days before surgery using the following schedule:     Please keep in mind the following:  DO NOT shave, including legs and underarms, starting the day of your first shower.   You may shave your face at any point before/day of surgery.  Place clean sheets on your bed the day you start using CHG soap. Use a clean washcloth (not used since being washed) for each shower. DO NOT sleep with pets once you start using the CHG.   CHG Shower Instructions:  Wash  your face and private area with normal soap. If you choose to wash your hair, wash first with your normal shampoo.  After you use shampoo/soap, rinse your hair and body thoroughly to remove shampoo/soap residue.  Turn the water OFF and apply about 3 tablespoons (45 ml) of CHG soap to a CLEAN washcloth.  Apply CHG soap ONLY FROM YOUR NECK DOWN TO YOUR TOES (washing for 3-5 minutes)  DO NOT use CHG soap on face, private areas, open wounds, or sores.  Pay special attention to the area where your surgery is being performed.  If you are having back surgery, having someone wash your back for you may be helpful. Wait 2 minutes after CHG soap is applied, then you may rinse off the CHG soap.  Pat dry with a clean towel  Put on clean clothes/pajamas   If you choose to wear lotion, please use ONLY the CHG-compatible lotions that are listed below.  Additional instructions for the day of surgery: DO NOT APPLY any lotions, deodorants, cologne, or perfumes.   Do not bring valuables to the hospital. [facility] is not responsible for any belongings/valuables. Do not wear nail polish, gel polish, artificial nails, or any other type of covering on natural nails (fingers and toes) Do not wear jewelry or makeup Put on clean/comfortable clothes.  Please brush your teeth.  Ask your nurse before applying any prescription medications to the skin.     CHG Compatible Lotions   Aveeno Moisturizing lotion  Cetaphil Moisturizing Cream  Cetaphil Moisturizing Lotion  Clairol Herbal Essence Moisturizing Lotion, Dry Skin  Clairol Herbal Essence Moisturizing Lotion, Extra Dry Skin  Clairol Herbal Essence Moisturizing Lotion, Normal Skin  Curel Age Defying Therapeutic Moisturizing Lotion with Alpha Hydroxy  Curel Extreme Care Body Lotion  Curel Soothing Hands Moisturizing Hand Lotion  Curel Therapeutic Moisturizing Cream, Fragrance-Free  Curel Therapeutic Moisturizing Lotion, Fragrance-Free  Curel Therapeutic  Moisturizing Lotion, Original Formula  Eucerin Daily Replenishing Lotion  Eucerin Dry Skin Therapy Plus Alpha Hydroxy Crme  Eucerin Dry Skin Therapy Plus Alpha Hydroxy Lotion  Eucerin Original Crme  Eucerin Original Lotion  Eucerin Plus Crme Eucerin Plus Lotion  Eucerin TriLipid Replenishing Lotion  Keri Anti-Bacterial Hand Lotion  Keri Deep Conditioning Original Lotion Dry Skin Formula Softly Scented  Keri Deep Conditioning Original Lotion, Fragrance Free Sensitive Skin Formula  Keri Lotion Fast Absorbing Fragrance Free Sensitive Skin Formula  Keri Lotion Fast Absorbing Softly Scented Dry Skin Formula  Keri Original Lotion  Keri Skin Renewal Lotion Keri Silky Smooth Lotion  Keri Silky Smooth Sensitive Skin Lotion  Nivea Body Creamy Conditioning Oil  Nivea Body Extra Enriched Teacher, adult education Moisturizing Lotion Nivea Crme  Nivea Skin Firming Lotion  NutraDerm 30 Skin Lotion  NutraDerm Skin Lotion  NutraDerm Therapeutic Skin Cream  NutraDerm Therapeutic Skin Lotion  ProShield Protective Hand Cream  Provon moisturizing lotion  Please read over the following fact sheets that you were given.

## 2023-09-01 NOTE — Progress Notes (Signed)
 PCP - Prentice Tanda COME Cardiologist - Zahid Jungagadhwalla,MD   PPM/ICD - denies Device Orders -  Rep Notified -   Chest x-ray -  EKG - 03/02/23 Stress Test - 04/08/23 ECHO - 04/08/23 Cardiac Cath - 12/24/14  Sleep Study - denies CPAP -   Fasting Blood Sugar - na Checks Blood Sugar _____ times a day  Last dose of GLP1 agonist-  ba GLP1 instructions:   Blood Thinner Instructions:na Aspirin Instructions:pt states she will call Dr. Marda office today for instructions regarding holding aspirin.   ERAS Protcol -no PRE-SURGERY Ensure or G2-   COVID TEST- na   Anesthesia review: yes-  Patient denies shortness of breath, fever, cough and chest pain at PAT appointment   All instructions explained to the patient, with a verbal understanding of the material. Patient agrees to go over the instructions while at home for a better understanding. The opportunity to ask questions was provided.

## 2023-09-02 NOTE — Anesthesia Preprocedure Evaluation (Addendum)
 Anesthesia Evaluation  Patient identified by MRN, date of birth, ID band Patient awake    Reviewed: Allergy & Precautions, NPO status , Patient's Chart, lab work & pertinent test results  History of Anesthesia Complications Negative for: history of anesthetic complications  Airway Mallampati: III  TM Distance: >3 FB Neck ROM: Limited    Dental  (+) Dental Advisory Given   Pulmonary neg shortness of breath, neg sleep apnea, neg COPD, neg recent URI, former smoker   breath sounds clear to auscultation       Cardiovascular hypertension, Pt. on medications and Pt. on home beta blockers + CAD and + Past MI  + dysrhythmias  Rhythm:Regular  8-14 day Holter monitor 04/08/2023 ([facility]): Patient had a min HR of 39 bpm, max HR of 176 bpm, and avg HR of 69 bpm. Predominant underlying rhythm was Sinus Rhythm. Bundle Branch Block-IVCD was present. 8 Supraventricular Tachycardia runs occurred, the run with the fastest interval lasting 4 beats with a max rate of 176 bpm, the longest lasting 8 beats with an avg rate of 90 bpm. Isolated SVEs were rare  <1.0% , SVE Couplets were rare  <1.0% , and SVE Triplets were rare  <1.0% . Isolated VEs were rare  <1.0% , and no VE Couplets or VE Triplets were present.      Nuclear stress test 04/08/2023 ([facility]): Myocardial perfusion imaging test is normal. Overall left ventricular systolic function was normal. The calculated ejection fraction was measured at 76%. Gated SPECT imaging demonstrated normal wall motion.      TTE 04/08/2023 ([facility]): The left ventricular size is normal.  There is normal left ventricular wall thickness.  Left ventricular systolic function is normal.  LV ejection fraction = 55-60%.  There is no significant valvular stenosis or regurgitation.  There is no pericardial effusion.  IVC size was normal.  The aortic sinus is normal size.  Probably no significant change in comparison with  the prior study noted       Neuro/Psych  PSYCHIATRIC DISORDERS Anxiety Depression     Neuromuscular disease    GI/Hepatic ,GERD  Medicated and Controlled,,  Endo/Other  negative endocrine ROS    Renal/GU negative Renal ROS     Musculoskeletal  (+)  Fibromyalgia -  Abdominal   Peds  Hematology negative hematology ROS (+)   Anesthesia Other Findings   Reproductive/Obstetrics                              Anesthesia Physical Anesthesia Plan  ASA: 2  Anesthesia Plan: General   Post-op Pain Management: Ofirmev  IV (intra-op)*   Induction: Intravenous  PONV Risk Score and Plan: 4 or greater and Ondansetron , Dexamethasone  and Midazolam   Airway Management Planned: Oral ETT and Video Laryngoscope Planned  Additional Equipment: None  Intra-op Plan:   Post-operative Plan: Extubation in OR  Informed Consent: I have reviewed the patients History and Physical, chart, labs and discussed the procedure including the risks, benefits and alternatives for the proposed anesthesia with the patient or authorized representative who has indicated his/her understanding and acceptance.     Dental advisory given  Plan Discussed with: CRNA  Anesthesia Plan Comments: (PAT note written 09/02/2023 by Allison Zelenak, PA-C.  )         Anesthesia Quick Evaluation

## 2023-09-02 NOTE — Progress Notes (Signed)
 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   History includes former smoker (quit 1985), CAD/MI (s/p BMS LAD, PTCA DIAG 2011; CTO small LCX, o/w non-obstructive CAD 2016), recurrent syncope (~ 2020, reassuring neuro/cardiology evaluation), left BBB, HTN, HLD, GERD, fibromyalgia, anxiety, spinal surgery (L5-S1 fusion 03/12/2010), hysterectomy (1979), cholecystectomy (10/17/2003).  She had recent nuclear stress test, TTE and Holter monitor done by cardiologist Dr. Prentice with [facility] for evaluation of SOB, fatigue, palpitation with known CAD.  Results overall reassuring (see CV below or Care Everywhere). Dr. Louis reached out for cardiac clearance, but this was pending at the time of her PAT visit. Since then we received cardiac risk assessment from cardiologist Dr. Terryann. She was classified as moderate risk with recommendations to continue ASA 81 mg as she had cardiac stents. Since she had not received preoperative ASA instructions at the time of her PAT visit, she was advised to follow-up with Dr. Louis to clarify and notified Shanda at his office.   Anesthesia team to evaluate on the day of surgery.   VS: BP 109/73   Pulse (!) 57   Temp 36.6 C   Resp 17   Ht 5' 2 (1.575 m)   Wt 63.7 kg   SpO2 96%   BMI 25.68 kg/m    PROVIDERS: [facility] Terryann Fredericks, MD is cardiologist   LABS: Labs reviewed: Acceptable for surgery. (all labs ordered are listed, but only abnormal results are displayed)  Labs  Reviewed  BASIC METABOLIC PANEL WITH GFR - Abnormal; Notable for the following components:      Result Value   Glucose, Bld 101 (*)    All other components within normal limits  CBC - Abnormal; Notable for the following components:   RBC 3.77 (*)    RDW 11.4 (*)    All other components within normal limits  SURGICAL PCR SCREEN  TYPE AND SCREEN    OTHER: Sleep Deprived EEG 1221/2020 ([facility]): Interpretation  This is a normal awake and sleep ambulatory EEG.  - Clinical correlation  A normal EEG does not rule out a seizure  disorder.  Multiple spells of interest were captured, no EEG  correlate suggestive of seizure activity was seen with any of  these pushbutton activations.  - No epileptiform activity was identified.  No seizures were  captured.    IMAGES: MRI C-spine 07/26/2023: IMPRESSION: Degenerative spondylosis at C4-5, C5-6 and C6-7. Canal narrowing with AP diameter of the canal as narrow as 6.9 mm at C4-5 and 8.1 mm at C5-6 and 7.9 mm at C6-7. Slight cord indentation at C4-5. No abnormal cord signal. Bilateral foraminal stenosis at C4-5 and C5-6 that could affect either or both C5 and C6 nerves. Mild bilateral foraminal narrowing at C6-7.  MRI T-spine 07/26/2023: IMPRESSION: 1. No likely significant finding in the thoracic region. No significant disc pathology. No stenosis of the canal or foramina. 2. Very minimal anterior endplate edema at U2-1. This is so minimal that it is likely subclinical, although there is some  potential that could be associated with regional pain.  CT Abd/pelvis 02/17/2023 ([facility]): IMPRESSION: 1. Sigmoid diverticulosis.  2. Evidence of prior cholecystectomy and hysterectomy.  3. Postoperative changes within the lower lumbar spine.  4. Aortic atherosclerosis.   1V CXR 02/17/2023 ([facility]): FINDINGS:  Single frontal view of the chest demonstrates a stable cardiac  silhouette. No airspace disease, effusion, or pneumothorax. No acute   bony abnormalities.  IMPRESSION: No acute intrathoracic process.    EKG: 03/02/2023 ([facility]; scanned under [facility] tab, AMB Correspondence, page 12): Sinus bradycardia at 53 bpm First degree A-V block  Left axis deviation  Left bundle branch block    CV: 8-14 day Holter monitor 04/08/2023 ([facility]): FINAL PROVIDER INTERPRETATION  Agree with Findings.   PRELIMINARY FINDINGS  Patient had a min HR of 39 bpm, max HR of 176 bpm, and avg HR of 69 bpm. Predominant underlying rhythm was Sinus Rhythm. Bundle Branch Block-IVCD was present. 8 Supraventricular Tachycardia runs occurred, the run with the fastest interval lasting 4 beats with a max rate of 176 bpm, the longest lasting 8 beats with an avg rate of 90 bpm. Isolated SVEs were rare  <1.0% , SVE Couplets were rare  <1.0% , and SVE Triplets were rare  <1.0% . Isolated VEs were rare  <1.0% , and no VE Couplets or VE Triplets were present.    Nuclear stress test 04/08/2023 ([facility]): Study Impression  Myocardial perfusion imaging test is normal.  Gated Data  Overall left ventricular systolic function was normal. The calculated ejection fraction was measured at 76%. Gated SPECT imaging demonstrated normal wall motion.    TTE 04/08/2023 ([facility]): The left ventricular size is normal.  There is normal left ventricular wall thickness.  Left ventricular systolic function is normal.  LV ejection fraction = 55-60%.  There is no significant valvular stenosis or regurgitation.  There is no pericardial effusion.  IVC size was normal.  The aortic sinus is normal size.  Probably no significant change in comparison with the prior study noted    US  Carotid 11/18/2018 ([facility]): Summary  1-39% stenosis bilaterally in the internal carotid arteries. No significant plaque noted.    Cardiac Cath 12/24/2014 [facility]): Summary  Chronic Total Occlusion of the Circ , unfavorable for PCI small, with  collaterals  Otherwise non-obstructive coronary  artery disease.  No restenosis of the LAD  Normal LV systolic function.  LV ejection fraction is 65-70 %  Recommendations  Medical therapy for CAD Risk factor reduction  Recommend work up for non-coronary causes of symptoms.   Past Medical History:  Diagnosis Date   Acid reflux    Anxiety    Complication of anesthesia    Coronary artery disease    2011 - BMS to LAD   Depression    Dysrhythmia    LBBB   Fibromyalgia    Hyperlipidemia    Hypertension    Myocardial infarction [facility])     Past Surgical History:  Procedure Laterality Date   ABDOMINAL HYSTERECTOMY     CHOLECYSTECTOMY     CORONARY STENT PLACEMENT     SPINE SURGERY     lumbar    MEDICATIONS:  albuterol  (VENTOLIN  HFA) 108 (90 Base) MCG/ACT inhaler   ALPRAZolam (XANAX) 1 MG tablet   aspirin 81 MG tablet   Cyanocobalamin (VITAMIN B-12 PO)   estradiol (ESTRACE VAGINAL) 0.1 MG/GM vaginal cream   ezetimibe (ZETIA) 10 MG tablet   ibuprofen (ADVIL) 600 MG tablet  linaclotide (LINZESS) 290 MCG CAPS capsule   losartan (COZAAR) 50 MG tablet   MAGNESIUM PO   metoCLOPramide (REGLAN) 10 MG tablet   metoprolol tartrate (LOPRESSOR) 25 MG tablet   nitroGLYCERIN (NITROSTAT) 0.4 MG SL tablet   Oxycodone  HCl 10 MG TABS   pantoprazole (PROTONIX) 40 MG tablet   POTASSIUM PO   rosuvastatin (CRESTOR) 10 MG tablet   No current facility-administered medications for this encounter.    Isaiah Ruder, PA-C Surgical Short Stay/Anesthesiology [facility] Phone [phone] [facility] Phone [phone] 09/02/2023 4:57 PM

## 2023-09-07 ENCOUNTER — Inpatient Hospital Stay (HOSPITAL_COMMUNITY): Payer: Self-pay | Admitting: Vascular Surgery

## 2023-09-07 ENCOUNTER — Inpatient Hospital Stay (HOSPITAL_COMMUNITY): Admitting: Certified Registered"

## 2023-09-07 ENCOUNTER — Inpatient Hospital Stay (HOSPITAL_COMMUNITY)
Admission: RE | Admit: 2023-09-07 | Discharge: 2023-09-08 | DRG: 472 | Disposition: A | Discharge status: Home Health | Attending: Neurosurgery | Admitting: Neurosurgery

## 2023-09-07 ENCOUNTER — Encounter (HOSPITAL_COMMUNITY)
Admission: RE | Disposition: A | Discharge status: Home Health | Payer: Self-pay | Source: Home / Self Care | Attending: Neurosurgery

## 2023-09-07 ENCOUNTER — Other Ambulatory Visit: Payer: Self-pay

## 2023-09-07 ENCOUNTER — Inpatient Hospital Stay (HOSPITAL_COMMUNITY)

## 2023-09-07 ENCOUNTER — Encounter (HOSPITAL_COMMUNITY): Payer: Self-pay | Admitting: Neurosurgery

## 2023-09-07 DIAGNOSIS — I252 Old myocardial infarction: Secondary | ICD-10-CM

## 2023-09-07 DIAGNOSIS — Z885 Allergy status to narcotic agent status: Secondary | ICD-10-CM

## 2023-09-07 DIAGNOSIS — G992 Myelopathy in diseases classified elsewhere: Secondary | ICD-10-CM | POA: Diagnosis not present

## 2023-09-07 DIAGNOSIS — I447 Left bundle-branch block, unspecified: Secondary | ICD-10-CM | POA: Diagnosis present

## 2023-09-07 DIAGNOSIS — M4802 Spinal stenosis, cervical region: Secondary | ICD-10-CM | POA: Diagnosis present

## 2023-09-07 DIAGNOSIS — Z833 Family history of diabetes mellitus: Secondary | ICD-10-CM

## 2023-09-07 DIAGNOSIS — M797 Fibromyalgia: Secondary | ICD-10-CM | POA: Diagnosis present

## 2023-09-07 DIAGNOSIS — Z955 Presence of coronary angioplasty implant and graft: Secondary | ICD-10-CM | POA: Diagnosis not present

## 2023-09-07 DIAGNOSIS — F32A Depression, unspecified: Secondary | ICD-10-CM | POA: Diagnosis present

## 2023-09-07 DIAGNOSIS — E785 Hyperlipidemia, unspecified: Secondary | ICD-10-CM | POA: Diagnosis present

## 2023-09-07 DIAGNOSIS — Z8249 Family history of ischemic heart disease and other diseases of the circulatory system: Secondary | ICD-10-CM

## 2023-09-07 DIAGNOSIS — Z888 Allergy status to other drugs, medicaments and biological substances status: Secondary | ICD-10-CM

## 2023-09-07 DIAGNOSIS — I2510 Atherosclerotic heart disease of native coronary artery without angina pectoris: Secondary | ICD-10-CM | POA: Diagnosis present

## 2023-09-07 DIAGNOSIS — Z90710 Acquired absence of both cervix and uterus: Secondary | ICD-10-CM | POA: Diagnosis not present

## 2023-09-07 DIAGNOSIS — Z79899 Other long term (current) drug therapy: Secondary | ICD-10-CM | POA: Diagnosis not present

## 2023-09-07 DIAGNOSIS — Z7982 Long term (current) use of aspirin: Secondary | ICD-10-CM | POA: Diagnosis not present

## 2023-09-07 DIAGNOSIS — I10 Essential (primary) hypertension: Secondary | ICD-10-CM | POA: Diagnosis present

## 2023-09-07 DIAGNOSIS — M4712 Other spondylosis with myelopathy, cervical region: Secondary | ICD-10-CM | POA: Diagnosis present

## 2023-09-07 DIAGNOSIS — M4722 Other spondylosis with radiculopathy, cervical region: Secondary | ICD-10-CM | POA: Diagnosis present

## 2023-09-07 DIAGNOSIS — K219 Gastro-esophageal reflux disease without esophagitis: Secondary | ICD-10-CM | POA: Diagnosis present

## 2023-09-07 DIAGNOSIS — F419 Anxiety disorder, unspecified: Secondary | ICD-10-CM | POA: Diagnosis present

## 2023-09-07 DIAGNOSIS — Z87891 Personal history of nicotine dependence: Secondary | ICD-10-CM

## 2023-09-07 HISTORY — PX: ANTERIOR CERVICAL DECOMP/DISCECTOMY FUSION: SHX1161

## 2023-09-07 SURGERY — ANTERIOR CERVICAL DECOMPRESSION/DISCECTOMY FUSION 3 LEVELS
Anesthesia: General

## 2023-09-07 MED ORDER — LIDOCAINE 2% (20 MG/ML) 5 ML SYRINGE
INTRAMUSCULAR | Status: DC | PRN
  Administered 2023-09-07: 60 mg via INTRAVENOUS

## 2023-09-07 MED ORDER — OXYCODONE HCL 5 MG PO TABS: 5.0000 mg | ORAL_TABLET | Freq: Once | ORAL | Status: DC | PRN

## 2023-09-07 MED ORDER — LIDOCAINE 2% (20 MG/ML) 5 ML SYRINGE
INTRAMUSCULAR | Status: AC
  Filled 2023-09-07: qty 5

## 2023-09-07 MED ORDER — PROPOFOL 10 MG/ML IV BOLUS
INTRAVENOUS | Status: DC | PRN
  Administered 2023-09-07: 110 mg via INTRAVENOUS

## 2023-09-07 MED ORDER — OXYCODONE HCL 5 MG PO TABS
10.0000 mg | ORAL_TABLET | ORAL | Status: DC | PRN
  Administered 2023-09-07 – 2023-09-08 (×6): 10 mg via ORAL
  Filled 2023-09-07 (×6): qty 2

## 2023-09-07 MED ORDER — FENTANYL CITRATE (PF) 100 MCG/2ML IJ SOLN
25.0000 ug | INTRAMUSCULAR | Status: DC | PRN
  Administered 2023-09-07 (×2): 50 ug via INTRAVENOUS

## 2023-09-07 MED ORDER — METOCLOPRAMIDE HCL 10 MG PO TABS: 10.0000 mg | ORAL_TABLET | Freq: Two times a day (BID) | ORAL | Status: DC | PRN

## 2023-09-07 MED ORDER — DEXAMETHASONE SODIUM PHOSPHATE 10 MG/ML IJ SOLN
INTRAMUSCULAR | Status: AC
  Filled 2023-09-07: qty 1

## 2023-09-07 MED ORDER — LACTATED RINGERS IV SOLN: INTRAVENOUS | Status: DC

## 2023-09-07 MED ORDER — PROPOFOL 10 MG/ML IV BOLUS
INTRAVENOUS | Status: AC
  Filled 2023-09-07: qty 20

## 2023-09-07 MED ORDER — ROCURONIUM BROMIDE 10 MG/ML (PF) SYRINGE
PREFILLED_SYRINGE | INTRAVENOUS | Status: DC | PRN
  Administered 2023-09-07: 10 mg via INTRAVENOUS
  Administered 2023-09-07: 60 mg via INTRAVENOUS

## 2023-09-07 MED ORDER — SODIUM CHLORIDE 0.9 % IV SOLN: 250.0000 mL | INTRAVENOUS | Status: DC

## 2023-09-07 MED ORDER — FENTANYL CITRATE (PF) 250 MCG/5ML IJ SOLN
INTRAMUSCULAR | Status: AC
  Filled 2023-09-07: qty 5

## 2023-09-07 MED ORDER — VITAMIN B-12 100 MCG PO TABS: 100.0000 ug | ORAL_TABLET | Freq: Every day | ORAL | Status: DC | PRN

## 2023-09-07 MED ORDER — ONDANSETRON HCL 4 MG PO TABS: 4.0000 mg | ORAL_TABLET | Freq: Four times a day (QID) | ORAL | Status: DC | PRN

## 2023-09-07 MED ORDER — CHLORHEXIDINE GLUCONATE 0.12 % MT SOLN: 15.0000 mL | Freq: Once | OROMUCOSAL | Status: DC

## 2023-09-07 MED ORDER — ACETAMINOPHEN 650 MG RE SUPP: 650.0000 mg | RECTAL | Status: DC | PRN

## 2023-09-07 MED ORDER — ACETAMINOPHEN 10 MG/ML IV SOLN
1000.0000 mg | Freq: Once | INTRAVENOUS | Status: DC | PRN
  Administered 2023-09-07: 1000 mg via INTRAVENOUS

## 2023-09-07 MED ORDER — NITROGLYCERIN 0.4 MG SL SUBL: 0.4000 mg | SUBLINGUAL_TABLET | SUBLINGUAL | Status: DC | PRN

## 2023-09-07 MED ORDER — THROMBIN 20000 UNITS EX SOLR
CUTANEOUS | Status: DC | PRN
  Administered 2023-09-07: 20 mL via TOPICAL

## 2023-09-07 MED ORDER — ACETAMINOPHEN 10 MG/ML IV SOLN
INTRAVENOUS | Status: AC
Start: 2023-09-07 — End: 2023-09-07
  Filled 2023-09-07: qty 100

## 2023-09-07 MED ORDER — SODIUM CHLORIDE 0.9% FLUSH
3.0000 mL | Freq: Two times a day (BID) | INTRAVENOUS | Status: DC
  Administered 2023-09-07: 3 mL via INTRAVENOUS

## 2023-09-07 MED ORDER — THROMBIN 5000 UNITS EX KIT
PACK | CUTANEOUS | Status: AC
  Filled 2023-09-07: qty 1

## 2023-09-07 MED ORDER — PHENYLEPHRINE HCL-NACL 20-0.9 MG/250ML-% IV SOLN
INTRAVENOUS | Status: DC | PRN
  Administered 2023-09-07: 20 ug/min via INTRAVENOUS

## 2023-09-07 MED ORDER — ROSUVASTATIN CALCIUM 5 MG PO TABS
10.0000 mg | ORAL_TABLET | Freq: Every day | ORAL | Status: DC
  Administered 2023-09-07: 10 mg via ORAL
  Filled 2023-09-07: qty 2

## 2023-09-07 MED ORDER — METOPROLOL TARTRATE 25 MG PO TABS
25.0000 mg | ORAL_TABLET | Freq: Two times a day (BID) | ORAL | Status: DC
  Administered 2023-09-07 – 2023-09-08 (×2): 25 mg via ORAL
  Filled 2023-09-07 (×2): qty 1

## 2023-09-07 MED ORDER — CEFAZOLIN SODIUM-DEXTROSE 1-4 GM/50ML-% IV SOLN
1.0000 g | Freq: Three times a day (TID) | INTRAVENOUS | Status: AC
  Administered 2023-09-07 – 2023-09-08 (×2): 1 g via INTRAVENOUS
  Filled 2023-09-07 (×2): qty 50

## 2023-09-07 MED ORDER — MIDAZOLAM HCL 2 MG/2ML IJ SOLN
INTRAMUSCULAR | Status: DC | PRN
  Administered 2023-09-07: 1 mg via INTRAVENOUS

## 2023-09-07 MED ORDER — FENTANYL CITRATE (PF) 250 MCG/5ML IJ SOLN
INTRAMUSCULAR | Status: DC | PRN
  Administered 2023-09-07: 100 ug via INTRAVENOUS

## 2023-09-07 MED ORDER — ACETAMINOPHEN 325 MG PO TABS: 650.0000 mg | ORAL_TABLET | ORAL | Status: DC | PRN

## 2023-09-07 MED ORDER — CHLORHEXIDINE GLUCONATE CLOTH 2 % EX PADS: 6.0000 | MEDICATED_PAD | Freq: Once | CUTANEOUS | Status: DC

## 2023-09-07 MED ORDER — SUGAMMADEX SODIUM 200 MG/2ML IV SOLN
INTRAVENOUS | Status: DC | PRN
  Administered 2023-09-07: 200 mg via INTRAVENOUS

## 2023-09-07 MED ORDER — LINACLOTIDE 145 MCG PO CAPS: 290.0000 ug | ORAL_CAPSULE | Freq: Every day | ORAL | Status: DC | PRN

## 2023-09-07 MED ORDER — POTASSIUM 99 MG PO TABS: ORAL_TABLET | Freq: Every day | ORAL | Status: DC

## 2023-09-07 MED ORDER — ONDANSETRON HCL 4 MG/2ML IJ SOLN
INTRAMUSCULAR | Status: AC
  Filled 2023-09-07: qty 2

## 2023-09-07 MED ORDER — FENTANYL CITRATE (PF) 100 MCG/2ML IJ SOLN
INTRAMUSCULAR | Status: AC
  Filled 2023-09-07: qty 2

## 2023-09-07 MED ORDER — ORAL CARE MOUTH RINSE: 15.0000 mL | Freq: Once | OROMUCOSAL | Status: AC

## 2023-09-07 MED ORDER — ONDANSETRON HCL 4 MG/2ML IJ SOLN
INTRAMUSCULAR | Status: DC | PRN
  Administered 2023-09-07: 4 mg via INTRAVENOUS

## 2023-09-07 MED ORDER — HYDROCODONE-ACETAMINOPHEN 5-325 MG PO TABS: 1.0000 | ORAL_TABLET | ORAL | Status: DC | PRN

## 2023-09-07 MED ORDER — SODIUM CHLORIDE 0.9% FLUSH: 3.0000 mL | INTRAVENOUS | Status: DC | PRN

## 2023-09-07 MED ORDER — DEXAMETHASONE SODIUM PHOSPHATE 10 MG/ML IJ SOLN
INTRAMUSCULAR | Status: DC | PRN
  Administered 2023-09-07: 10 mg via INTRAVENOUS

## 2023-09-07 MED ORDER — CYCLOBENZAPRINE HCL 10 MG PO TABS
10.0000 mg | ORAL_TABLET | Freq: Three times a day (TID) | ORAL | Status: DC | PRN
Start: 2023-09-07 — End: 2023-09-08
  Administered 2023-09-07 – 2023-09-08 (×2): 10 mg via ORAL
  Filled 2023-09-07 (×2): qty 1

## 2023-09-07 MED ORDER — THROMBIN 5000 UNITS EX SOLR
OROMUCOSAL | Status: DC | PRN
  Administered 2023-09-07: 5 mL via TOPICAL

## 2023-09-07 MED ORDER — ROCURONIUM BROMIDE 10 MG/ML (PF) SYRINGE
PREFILLED_SYRINGE | INTRAVENOUS | Status: AC
  Filled 2023-09-07: qty 10

## 2023-09-07 MED ORDER — THROMBIN 20000 UNITS EX SOLR
CUTANEOUS | Status: AC
  Filled 2023-09-07: qty 20000

## 2023-09-07 MED ORDER — ALBUTEROL SULFATE (2.5 MG/3ML) 0.083% IN NEBU: 2.5000 mg | INHALATION_SOLUTION | Freq: Four times a day (QID) | RESPIRATORY_TRACT | Status: DC | PRN

## 2023-09-07 MED ORDER — PANTOPRAZOLE SODIUM 40 MG PO TBEC
40.0000 mg | DELAYED_RELEASE_TABLET | Freq: Every day | ORAL | Status: DC
  Administered 2023-09-08: 40 mg via ORAL
  Filled 2023-09-07: qty 1

## 2023-09-07 MED ORDER — MENTHOL 3 MG MT LOZG
1.0000 | LOZENGE | OROMUCOSAL | Status: DC | PRN
  Administered 2023-09-07: 3 mg via ORAL
  Filled 2023-09-07: qty 9

## 2023-09-07 MED ORDER — CEFAZOLIN SODIUM-DEXTROSE 2-4 GM/100ML-% IV SOLN
2.0000 g | INTRAVENOUS | Status: AC
  Administered 2023-09-07: 2 g via INTRAVENOUS
  Filled 2023-09-07: qty 100

## 2023-09-07 MED ORDER — OXYCODONE HCL 5 MG/5ML PO SOLN: 5.0000 mg | Freq: Once | ORAL | Status: DC | PRN

## 2023-09-07 MED ORDER — 0.9 % SODIUM CHLORIDE (POUR BTL) OPTIME
TOPICAL | Status: DC | PRN
  Administered 2023-09-07: 1000 mL

## 2023-09-07 MED ORDER — ALPRAZOLAM 0.5 MG PO TABS: 0.5000 mg | ORAL_TABLET | Freq: Three times a day (TID) | ORAL | Status: DC | PRN

## 2023-09-07 MED ORDER — ORAL CARE MOUTH RINSE: 15.0000 mL | Freq: Once | OROMUCOSAL | Status: DC

## 2023-09-07 MED ORDER — CHLORHEXIDINE GLUCONATE 0.12 % MT SOLN
15.0000 mL | Freq: Once | OROMUCOSAL | Status: AC
  Administered 2023-09-07: 15 mL via OROMUCOSAL
  Filled 2023-09-07: qty 15

## 2023-09-07 MED ORDER — EZETIMIBE 10 MG PO TABS
10.0000 mg | ORAL_TABLET | Freq: Every day | ORAL | Status: DC
  Administered 2023-09-07: 10 mg via ORAL
  Filled 2023-09-07: qty 1

## 2023-09-07 MED ORDER — LOSARTAN POTASSIUM 25 MG PO TABS
25.0000 mg | ORAL_TABLET | Freq: Every day | ORAL | Status: DC
  Administered 2023-09-07: 25 mg via ORAL
  Filled 2023-09-07: qty 1

## 2023-09-07 MED ORDER — ONDANSETRON HCL 4 MG/2ML IJ SOLN: 4.0000 mg | Freq: Four times a day (QID) | INTRAMUSCULAR | Status: DC | PRN

## 2023-09-07 MED ORDER — HYDROMORPHONE HCL 1 MG/ML IJ SOLN: 1.0000 mg | INTRAMUSCULAR | Status: DC | PRN

## 2023-09-07 MED ORDER — PHENOL 1.4 % MT LIQD: 1.0000 | OROMUCOSAL | Status: DC | PRN

## 2023-09-07 MED ORDER — MIDAZOLAM HCL 2 MG/2ML IJ SOLN
INTRAMUSCULAR | Status: AC
  Filled 2023-09-07: qty 2

## 2023-09-07 MED ORDER — MAGNESIUM OXIDE -MG SUPPLEMENT 400 (240 MG) MG PO TABS
400.0000 mg | ORAL_TABLET | Freq: Every day | ORAL | Status: DC
  Administered 2023-09-07 – 2023-09-08 (×2): 400 mg via ORAL
  Filled 2023-09-07 (×2): qty 1

## 2023-09-07 SURGICAL SUPPLY — 42 items
BAG COUNTER SPONGE SURGICOUNT (BAG) ×1 IMPLANT
BAND RUBBER #18 3X1/16 STRL (MISCELLANEOUS) ×2 IMPLANT
BENZOIN TINCTURE PRP APPL 2/3 (GAUZE/BANDAGES/DRESSINGS) ×1 IMPLANT
BIT DRILL 13 (BIT) IMPLANT
BUR MATCHSTICK NEURO 3.0 LAGG (BURR) ×1 IMPLANT
CANISTER SUCTION 3000ML PPV (SUCTIONS) ×1 IMPLANT
DRAPE C-ARM 42X72 X-RAY (DRAPES) ×2 IMPLANT
DRAPE LAPAROTOMY 100X72 PEDS (DRAPES) ×1 IMPLANT
DRAPE MICROSCOPE SLANT 54X150 (MISCELLANEOUS) ×1 IMPLANT
DURAPREP 6ML APPLICATOR 50/CS (WOUND CARE) ×1 IMPLANT
ELECT COATED BLADE 2.86 ST (ELECTRODE) ×1 IMPLANT
ELECTRODE REM PT RTRN 9FT ADLT (ELECTROSURGICAL) ×1 IMPLANT
GAUZE 4X4 16PLY ~~LOC~~+RFID DBL (SPONGE) IMPLANT
GAUZE SPONGE 4X4 12PLY STRL (GAUZE/BANDAGES/DRESSINGS) ×1 IMPLANT
GLOVE ECLIPSE 9.0 STRL (GLOVE) ×1 IMPLANT
GLOVE EXAM NITRILE XL STR (GLOVE) IMPLANT
GOWN STRL REUS W/ TWL LRG LVL3 (GOWN DISPOSABLE) IMPLANT
GOWN STRL REUS W/ TWL XL LVL3 (GOWN DISPOSABLE) IMPLANT
GOWN STRL REUS W/TWL 2XL LVL3 (GOWN DISPOSABLE) IMPLANT
HALTER HD/CHIN CERV TRACTION D (MISCELLANEOUS) ×1 IMPLANT
HEMOSTAT POWDER KIT SURGIFOAM (HEMOSTASIS) ×1 IMPLANT
KIT BASIN OR (CUSTOM PROCEDURE TRAY) ×1 IMPLANT
KIT TURNOVER KIT B (KITS) ×1 IMPLANT
NDL SPNL 20GX3.5 QUINCKE YW (NEEDLE) ×1 IMPLANT
NEEDLE SPNL 20GX3.5 QUINCKE YW (NEEDLE) ×1 IMPLANT
NS IRRIG 1000ML POUR BTL (IV SOLUTION) ×1 IMPLANT
PACK LAMINECTOMY NEURO (CUSTOM PROCEDURE TRAY) ×1 IMPLANT
PAD ARMBOARD POSITIONER FOAM (MISCELLANEOUS) ×3 IMPLANT
PLATE 3 57.5XLCK NS SPNE CVD (Plate) IMPLANT
SCREW ST FIX 4 ATL 3120213 (Screw) IMPLANT
SPACER SPNL 11X14X6XPEEK CVD (Cage) IMPLANT
SPONGE INTESTINAL PEANUT (DISPOSABLE) ×1 IMPLANT
SPONGE SURGIFOAM ABS GEL 100 (HEMOSTASIS) ×1 IMPLANT
STRIP CLOSURE SKIN 1/2X4 (GAUZE/BANDAGES/DRESSINGS) ×1 IMPLANT
SUT VIC AB 3-0 SH 8-18 (SUTURE) ×1 IMPLANT
SUT VIC AB 4-0 RB1 18 (SUTURE) ×1 IMPLANT
TAPE CLOTH 4X10 WHT NS (GAUZE/BANDAGES/DRESSINGS) ×1 IMPLANT
TAPE CLOTH SURG 4X10 WHT LF (GAUZE/BANDAGES/DRESSINGS) IMPLANT
TOWEL GREEN STERILE (TOWEL DISPOSABLE) ×1 IMPLANT
TOWEL GREEN STERILE FF (TOWEL DISPOSABLE) ×1 IMPLANT
TRAP SPECIMEN MUCUS 40CC (MISCELLANEOUS) ×1 IMPLANT
WATER STERILE IRR 1000ML POUR (IV SOLUTION) ×1 IMPLANT

## 2023-09-07 NOTE — Op Note (Signed)
 Date of procedure: 09/07/2023  Date of dictation: Same  Service: Neurosurgery  Preoperative diagnosis: Cervical spondylosis with stenosis and myelopathy and radiculopathy  Postoperative diagnosis: Same  Procedure Name: C4-5, C5-6, C6-7 anterior cervical discectomy with interbody fusion utilizing interbody cages, local harvested autograft, and anterior plate instrumentation  Surgeon:Alleta Avery A.Berniece Abid, M.D.  Asst. Surgeon: Jennetta, NP  Anesthesia: General  Indication: 67 year old female with neck and inner scapular pain with associated numbness and paresthesias into both upper extremities.  Workup demonstrates evidence of marked cervical disc generation with associated spondylosis and stenosis worse at C4-5 but also present at C5-6 and C6-7.  Patient presents now for three-level anterior cervical decompression and fusion in hopes improving her symptoms.  Operative note: After induction of anesthesia, patient positioned supine with neck slightly extended L placeholder traction.  Patient's anterior cervical region prepped and draped sterilely.  Incision made overlying C5-6.  Dissection performed to the right.  Retractor placed.  Fluoroscopy used.  Levels confirmed.  Disc bases C4-5, C5-6 and C6-7 were then incised.  Discectomy was then performed using various instruments down to the level of the posterior annulus.  Microscope was then brought into the field used throughout the remainder of the discectomy.  Starting first at C4-5 remaining aspects of annulus and osteophytes were removed using high-speed drill down to level the posterior logical ligament.  Posterior logical was then elevated and resected in a piecemeal fashion.  Underlying thecal sac was identified.  A wide central decompression then performed undercutting the bodies of C4 and C5.  Decompression then proceeded each neural foramina.  Wide anterior foraminotomies performed along the course the exiting nerve roots bilaterally.  At this point a  very thorough decompression had been achieved.  There was no evidence of injury to the thecal sac or nerve roots.  Wound was then irrigated.  Gelfoam was placed topically for hemostasis.  Procedure was then repeated at C5-6 and C6-7 in a similar fashion again without complications.  Displaces at all 3 levels were measured and 6 mm [employer] anatomic peek cages were packed with locally harvested autograft and impacted into place at all 3 levels.  Each cage was recessed slightly from the anterior cortical margin.  Atlantis translational anterior cervical plate was then placed over the C4, C5, C6 and C7 levels.  This then attached under fluoroscopic guidance using 13 mm fixed angle screws to each at all screws to get a final tightening 5 to be solidly within the bone.  Locking screws engaged at all 4 levels.  Final images reveal good position of the cages and the hardware throughout proper level with normal alignment of spine.  Wound was then irrigated.  Hemostasis was assured with bipolar cautery.  Wound was then closed in layers with Vicryl sutures.  Steri-Strips and sterile dressing were applied.  No apparent complications.  Patient tolerated the procedure well and she returns to the recovery room postop.SABRA

## 2023-09-07 NOTE — H&P (Signed)
 [name] is an 68 y.o. female.   Chief Complaint: Neck pain HPI: 68 year old female with chronic and progressively worsening posterior cervical pain with radiation to her interscapular region.  Patient also with radiating numbness and paresthesias in both upper extremities with some subjective feelings of weakness.  Workup demonstrates evidence of marked cervical degeneration with associated spondylosis and moderate cervical stenosis and severe neuroforaminal stenosis at C4-5, C5-6 and C6-7.  Patient has failed conservative management presents now for three-level anterior cervical decompression and fusion in hopes of improving her symptoms.  Past Medical History:  Diagnosis Date   Acid reflux    Anxiety    Complication of anesthesia    Coronary artery disease    2011 - BMS to LAD   Depression    Dysrhythmia    LBBB   Fibromyalgia    Hyperlipidemia    Hypertension    Myocardial infarction [facility])     Past Surgical History:  Procedure Laterality Date   ABDOMINAL HYSTERECTOMY     CHOLECYSTECTOMY     CORONARY STENT PLACEMENT     SPINE SURGERY     lumbar    Family History  Problem Relation Age of Onset   Heart disease Mother    Diabetes Mother    Heart disease Brother    Social History:  reports that she quit smoking about 40 years ago. Her smoking use included cigarettes. She started smoking about 50 years ago. She has a 20 pack-year smoking history. She has never used smokeless tobacco. She reports that she does not drink alcohol and does not use drugs.  Allergies:  Allergies  Allergen Reactions   Codeine Itching   Effexor [Venlafaxine] Other (See Comments)    Unknown reaction   Lyrica  [Pregabalin ] Swelling   Pravastatin Other (See Comments)    Myalgias  Myopathy    Topamax  [Topiramate ] Other (See Comments)    Unknown reaction   Zestril [Lisinopril] Other (See Comments)    Myopathy     Medications Prior to Admission  Medication Sig Dispense Refill   albuterol   (VENTOLIN  HFA) 108 (90 Base) MCG/ACT inhaler Inhale 2 puffs into the lungs every 6 (six) hours as needed for wheezing or shortness of breath.     ALPRAZolam  (XANAX ) 1 MG tablet Take 0.5-1 mg by mouth 3 (three) times daily as needed for anxiety.      aspirin 81 MG tablet Take 81 mg by mouth daily.     Cyanocobalamin (VITAMIN B-12 PO) Take 1 tablet by mouth daily as needed (fatigue).     estradiol (ESTRACE VAGINAL) 0.1 MG/GM vaginal cream Place 1 application vaginally once a week.     ezetimibe  (ZETIA ) 10 MG tablet Take 10 mg by mouth at bedtime.     ibuprofen (ADVIL) 600 MG tablet Take 600 mg by mouth 2 (two) times daily as needed (pain, headache).     linaclotide  (LINZESS ) 290 MCG CAPS capsule Take 290 mcg by mouth as needed (constipation).     losartan  (COZAAR ) 50 MG tablet Take 25 mg by mouth at bedtime.     MAGNESIUM  PO Take 1 tablet by mouth daily.     metoprolol  tartrate (LOPRESSOR ) 25 MG tablet Take 25 mg by mouth 2 (two) times daily.     Oxycodone  HCl 10 MG TABS Take 1 tablet (10 mg total) by mouth every 6 (six) hours as needed. 90 tablet 0   pantoprazole  (PROTONIX ) 40 MG tablet Take 40 mg by mouth daily.     POTASSIUM  PO Take 1 tablet by mouth daily. OTC     rosuvastatin  (CRESTOR ) 10 MG tablet Take 10 mg by mouth at bedtime.     metoCLOPramide  (REGLAN ) 10 MG tablet Take 10 mg by mouth 2 (two) times daily as needed for vomiting or nausea.     nitroGLYCERIN  (NITROSTAT ) 0.4 MG SL tablet Place 4 mg under the tongue every 5 (five) minutes as needed for chest pain.      No results found for this or any previous visit (from the past 48 hours). No results found.  Pertinent items noted in HPI and remainder of comprehensive ROS otherwise negative.  Blood pressure 124/81, pulse (!) 57, temperature 98.1 F (36.7 C), resp. rate 18, height 5' 2 (1.575 m), weight 63.7 kg, SpO2 97%.  Patient is awake and alert.  She is oriented and appropriate.  Speech is fluent.  Judgment insight are intact.   Cranial nerve function normal bilateral.  Motor examination reveals intact motor strength bilaterally sensory examination with some patchy distal sensory loss in both upper extremities.  Deep tendon reflexes are brisk and symmetric.  No evidence of long track signs.  Examination head ears eyes nose throat is unremarked.  Chest and abdomen are benign.  Extremities are free from injury deformity. 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  Plan C4-5, C5-6, C6-7 anterior cervical discectomy with interbody fusion utilizing interbody cages, local harvested autograft, and anterior plate instrumentation.  Risks and benefits been explained.  Patient wishes to proceed.  [name] 09/07/2023, 11:04 AM

## 2023-09-07 NOTE — Anesthesia Procedure Notes (Signed)
 Procedure Name: Intubation Date/Time: 09/07/2023 12:06 PM  Performed by: Worth Catherene Flores, CRNAPre-anesthesia Checklist: Patient identified, Emergency Drugs available, Suction available and Patient being monitored Patient Re-evaluated:Patient Re-evaluated prior to induction Oxygen Delivery Method: Circle system utilized Preoxygenation: Pre-oxygenation with 100% oxygen Induction Type: IV induction Ventilation: Mask ventilation without difficulty Laryngoscope Size: Mac and 3 Grade View: Grade II Tube type: Oral Tube size: 7.0 mm Number of attempts: 1 Airway Equipment and Method: Stylet and Oral airway Placement Confirmation: ETT inserted through vocal cords under direct vision, positive ETCO2 and breath sounds checked- equal and bilateral Secured at: 21 cm Tube secured with: Tape Dental Injury: Teeth and Oropharynx as per pre-operative assessment

## 2023-09-07 NOTE — Transfer of Care (Signed)
 Immediate Anesthesia Transfer of Care Note  Patient: [name]  Procedure(s) Performed: ANTERIOR CERVICAL DECOMPRESSION/DISCECTOMY FUSION CERVICAL FOUR- CERVICAL FIVE - CERVICAL FIVE-CERVICAL SIX - CERVICAL SIX-CERVICAL SEVEN  Patient Location: PACU  Anesthesia Type:General  Level of Consciousness: awake and alert   Airway & Oxygen Therapy: Patient Spontanous Breathing and Patient connected to nasal cannula oxygen  Post-op Assessment: Report given to RN and Post -op Vital signs reviewed and stable  Post vital signs: Reviewed and stable  Last Vitals:  Vitals Value Taken Time  BP 135/71 09/07/23 14:32  Temp    Pulse 77 09/07/23 14:35  Resp 18 09/07/23 14:35  SpO2 95 % 09/07/23 14:35  Vitals shown include unfiled device data.  Last Pain:  Vitals:   09/07/23 1007  PainSc: 8          Complications: No notable events documented.

## 2023-09-07 NOTE — Progress Notes (Signed)
 Orthopedic Tech Progress Note Patient Details:  [name] [facility] 05/12/1955 [id number]  Ortho Devices Type of Ortho Device: Soft collar Ortho Device/Splint Location: Neck Ortho Device/Splint Interventions: Application, Ordered   Post Interventions Patient Tolerated: Well  [name] A Yatziry Deakins 09/07/2023, 4:03 PM

## 2023-09-07 NOTE — Brief Op Note (Signed)
 09/07/2023  2:21 PM  PATIENT:  [name]  68 y.o. female  PRE-OPERATIVE DIAGNOSIS:  Stenosis of cervical spine with myelopathy  POST-OPERATIVE DIAGNOSIS:  Stenosis of cervical spine with myelopathy  PROCEDURE:  Procedure(s) with comments: ANTERIOR CERVICAL DECOMPRESSION/DISCECTOMY FUSION CERVICAL FOUR- CERVICAL FIVE - CERVICAL FIVE-CERVICAL SIX - CERVICAL SIX-CERVICAL SEVEN (N/A) - ACDF - C4-C5 - C5-C6 - C6-C7  SURGEON:  Surgeons and Role:    DEWAINE Louis Shove, MD - Primary  PHYSICIAN ASSISTANT:   ASSISTANTSBETHA Jennetta PIETY   ANESTHESIA:   general  EBL:  50cc   BLOOD ADMINISTERED:none  DRAINS: none   LOCAL MEDICATIONS USED:  NONE  SPECIMEN:  No Specimen  DISPOSITION OF SPECIMEN:  N/A  COUNTS:  YES  TOURNIQUET:  * No tourniquets in log *  DICTATION: .Dragon Dictation  PLAN OF CARE: Admit to inpatient   PATIENT DISPOSITION:  PACU - hemodynamically stable.   Delay start of Pharmacological VTE agent (>24hrs) due to surgical blood loss or risk of bleeding: yes

## 2023-09-08 ENCOUNTER — Encounter (HOSPITAL_COMMUNITY): Payer: Self-pay | Admitting: Neurosurgery

## 2023-09-08 MED ORDER — OXYCODONE HCL 10 MG PO TABS
10.0000 mg | ORAL_TABLET | ORAL | 0 refills | Status: AC | PRN
Start: 2023-09-08 — End: ?

## 2023-09-08 MED ORDER — CYCLOBENZAPRINE HCL 10 MG PO TABS: 10.0000 mg | ORAL_TABLET | Freq: Three times a day (TID) | ORAL | 0 refills | Status: AC | PRN

## 2023-09-08 MED ORDER — NITROGLYCERIN 0.4 MG SL SUBL: 0.4000 mg | SUBLINGUAL_TABLET | SUBLINGUAL | 12 refills | Status: AC | PRN

## 2023-09-08 NOTE — Discharge Instructions (Signed)

## 2023-09-08 NOTE — Plan of Care (Signed)
 Pt and family given D/C instructions with verbal understanding. Rx's were sent to the pharmacy by MD. Pt's incision is clean and dry with no sign of infection. Pt's IV was removed prior to D/C. Pt D/C'd home via wheelchair per MD order. Pt received RW and 3-n-1 from [employer] per MD order. Pt is stable @ D/C and has no other needs at this time. Rosina Rakers, RN

## 2023-09-08 NOTE — Evaluation (Signed)
 Physical Therapy Evaluation Patient Details Name: [name] MRN: [id number] DOB: 06/22/1955 Today's Date: 09/08/2023  History of Present Illness  [name] is a 68 yo female who is s/p C4-5, C5-6, C6-7 anterior cervical discectomy with interbody fusion 9/2. PMHx: MI, HTN, HLD, fibromyalgia, depression, CAD, anxiety, acid reflux  Clinical Impression  Pt presents with condition above and deficits mentioned below, see PT Problem List. PTA, she was mod I   intermittently using a rollator vs SPC for functional mobility and a w/c for long distances. Pt lives with her husband in a 1-level house with 2-3 STE. Currently, the pt is limited by pain and displaying deficits in generalized strength, balance, and activity tolerance. The pt is able to ambulate slowly with CGA for safety when utilizing a RW for balance. The pt needed minA to navigate stairs without UE support. Educated pt and family on recs for pt to use RW for all standing mobility at d/c and have someone guard her and provide her with HHA on stairs to improve her safety. They verbalized understanding. Will continue to follow acutely and recommend HHPT upon d/c.        If plan is discharge home, recommend the following: A little help with walking and/or transfers;A little help with bathing/dressing/bathroom;Assistance with cooking/housework;Assist for transportation;Help with stairs or ramp for entrance   Can travel by private vehicle        Equipment Recommendations Rolling walker (2 wheels);BSC/3in1  Recommendations for Other Services       Functional Status Assessment Patient has had a recent decline in their functional status and demonstrates the ability to make significant improvements in function in a reasonable and predictable amount of time.     Precautions / Restrictions Precautions Precautions: Fall;Cervical Precaution Booklet Issued: Yes (comment) Recall of Precautions/Restrictions: Intact Precaution/Restrictions  Comments: reviewed precautins Required Braces or Orthoses: Cervical Brace Cervical Brace: Soft collar Restrictions Weight Bearing Restrictions Per Provider Order: No      Mobility  Bed Mobility Overal bed mobility: Needs Assistance Bed Mobility: Rolling, Sidelying to Sit, Sit to Sidelying Rolling: Contact guard assist, Used rails Sidelying to sit: Contact guard assist, HOB elevated, Used rails     Sit to sidelying: Contact guard assist, HOB elevated, Used rails General bed mobility comments: Extra time and cues needed to get elbow under her trunk to push up to sit L EOB. Pt used rail and HOB elevated throughout. CGA for safety    Transfers Overall transfer level: Needs assistance Equipment used: Rolling walker (2 wheels) Transfers: Sit to/from Stand Sit to Stand: Contact guard assist           General transfer comment: Cues needed to reach back prior to sitting, no LOB, CGA for safety    Ambulation/Gait Ambulation/Gait assistance: Contact guard assist Gait Distance (Feet): 60 Feet Assistive device: Rolling walker (2 wheels) Gait Pattern/deviations: Step-through pattern, Decreased step length - right, Decreased step length - left, Decreased stride length Gait velocity: reduced Gait velocity interpretation: <1.31 ft/sec, indicative of household ambulator   General Gait Details: Pt takes slow, small steps, needing cues to not get so proximal to the front bar of her RW. No LOB, CGA for safety. Educated pt and family for pt to use RW with all standing mobility at d/c. They verbalized understanding.  Stairs Stairs: Yes Stairs assistance: Min assist Stair Management: No rails, Step to pattern, Forwards Number of Stairs: 3 General stair comments: Pt ascends with slow steps, needing cues to extend her knees  rather than keep them flexed. Pt descends slowly, gradually bringing her toes slowly off the edge of the stair to touch the next step before getting foot flat and stepping  down bil. MinA for balance throughout. Educated pt and husband for her to have someone guard her and provide HHA on her stairs at home. They verbalized understanding.  Wheelchair Mobility     Tilt Bed    Modified Rankin (Stroke Patients Only)       Balance Overall balance assessment: Needs assistance Sitting-balance support: No upper extremity supported, Feet supported Sitting balance-Leahy Scale: Fair     Standing balance support: Single extremity supported, During functional activity, No upper extremity supported, Bilateral upper extremity supported Standing balance-Leahy Scale: Fair Standing balance comment: Able to stand without UE support but reliant on RW to ambulate                             Pertinent Vitals/Pain Pain Assessment Pain Assessment: Faces Faces Pain Scale: Hurts whole lot Pain Location: neck Pain Descriptors / Indicators: Discomfort, Grimacing, Guarding, Crying Pain Intervention(s): Monitored during session, Limited activity within patient's tolerance, Repositioned, Patient requesting pain meds-RN notified    Home Living Family/patient expects to be discharged to:: Private residence Living Arrangements: Spouse/significant other Available Help at Discharge: Family;Available 24 hours/day Type of Home: House (cabin) Home Access: Stairs to enter [employer]: None (at cabin, bar on L when ascending at x3 stairs at main house) [employer] of Steps: 2-3   Home Layout: One level Home Equipment: Rollator (4 wheels);Cane - single point;Shower seat;Grab bars - tub/shower;Wheelchair - manual Additional Comments: pt states she recently started staying in a cabin behind her main house, she goes into the main house for showers    Prior Function Prior Level of Function : Needs assist             Mobility Comments: intermittent use of rollator vs SPC, WC use for long distances ADLs Comments: relatively indep, husband assists  intermittently as needed     Extremity/Trunk Assessment   Upper Extremity Assessment Upper Extremity Assessment: Defer to OT evaluation RUE Deficits / Details: generalized weakness, tremors, pt reports her paraesthesias are worse post-op. ROM at elbow wrist and hands are [facility]. shoulders not assessed due to pain exacerbation LUE Deficits / Details: generalized weakness, tremors, pt reports her paraesthesias are worse post-op. ROM at elbow wrist and hands are [facility]. shoulders not assessed due to pain exacerbation    Lower Extremity Assessment Lower Extremity Assessment: RLE deficits/detail;LLE deficits/detail;Generalized weakness RLE Deficits / Details: MMT scores of 3/5 hip flexion, 3+/5 knee extension, 3/5 ankle dorsiflexion; denied numbness/tingling bil LLE Deficits / Details: MMT scores of 3/5 hip flexion, 3+/5 knee extension, 3/5 ankle dorsiflexion; denied numbness/tingling bil    Cervical / Trunk Assessment Cervical / Trunk Assessment: Neck Surgery  Communication   Communication Communication: Impaired Factors Affecting Communication: Reduced clarity of speech (mumbles)    Cognition Arousal: Alert Behavior During Therapy: Anxious, Lability   PT - Cognitive impairments: No family/caregiver present to determine baseline                       PT - Cognition Comments: Pt with noted limited effort at times, unsure if baseline or limited by pain. Pt labile, crying when seeing her family Following commands: Intact       Cueing Cueing Techniques: Verbal cues     General Comments General comments (  skin integrity, edema, etc.): Educated pt and family for pt to ambulate >/= x3 longer walks/day and on car transfers. They verbalized understanding.    Exercises     Assessment/Plan    PT Assessment Patient needs continued PT services  PT Problem List Decreased strength;Decreased activity tolerance;Decreased balance;Decreased mobility;Decreased knowledge of  precautions;Pain       PT Treatment Interventions Gait training;DME instruction;Stair training;Functional mobility training;Therapeutic activities;Therapeutic exercise;Balance training;Neuromuscular re-education;Patient/family education    PT Goals (Current goals can be found in the Care Plan section)  Acute Rehab PT Goals Patient Stated Goal: to reduce pain PT Goal Formulation: With patient/family Time For Goal Achievement: 09/15/23 Potential to Achieve Goals: Good    Frequency Min 5X/week     Co-evaluation               AM-PAC PT 6 Clicks Mobility  Outcome Measure Help needed turning from your back to your side while in a flat bed without using bedrails?: A Little Help needed moving from lying on your back to sitting on the side of a flat bed without using bedrails?: A Little Help needed moving to and from a bed to a chair (including a wheelchair)?: A Little Help needed standing up from a chair using your arms (e.g., wheelchair or bedside chair)?: A Little Help needed to walk in hospital room?: A Little Help needed climbing 3-5 steps with a railing? : A Little 6 Click Score: 18    End of Session Equipment Utilized During Treatment: Cervical collar Activity Tolerance: Patient limited by pain Patient left: in bed;with call bell/phone within reach;with family/visitor present Nurse Communication: Mobility status;Patient requests pain meds PT Visit Diagnosis: Unsteadiness on feet (R26.81);Other abnormalities of gait and mobility (R26.89);Muscle weakness (generalized) (M62.81);Difficulty in walking, not elsewhere classified (R26.2);Pain Pain - Right/Left:  (neck) Pain - part of body:  (neck)    Time: 9052-8997 PT Time Calculation (min) (ACUTE ONLY): 15 min   Charges:   PT Evaluation $PT Eval Moderate Complexity: 1 Mod   PT General Charges $$ ACUTE PT VISIT: 1 Visit         Theo Ferretti, PT, DPT Acute Rehabilitation Services  Office: [phone]   Theo CHRISTELLA Ferretti 09/08/2023, 11:27 AM

## 2023-09-08 NOTE — Evaluation (Signed)
 Occupational Therapy Evaluation Patient Details Name: [name] MRN: [id number] DOB: 09-23-1955 Today's Date: 09/08/2023   History of Present Illness   [name] is a 68 yo female who is s/p C4-5, C5-6, C6-7 anterior cervical discectomy with interbody fusion 9/2. PMHx: MI, HTN, HLD, fibromyalgia, depression, CAD, anxiety, acid reflux     Clinical Impressions [name] was evaluated s/p the above admission list. She is mod I for most things at baseline, however her husband does assist with ADLs or WC for long distances if needed. Upon evaluation the pt was limited by pain, anxiety, cervical precautions, surgical pain, limited activity tolerance, weakness, BUE paraesthesias and tremors/shaking. Overall she needed up to min A for bed mobility and STS with RW, CGA needed for static standing and mobility. Due to the deficits listed below the pt also needs up to mod A for LB ADLs and set up A for UB ADLs. Pt will benefit from continued acute OT services and HHOT.      If plan is discharge home, recommend the following:   A little help with walking and/or transfers;A lot of help with bathing/dressing/bathroom;Assistance with cooking/housework;Assist for transportation;Help with stairs or ramp for entrance     Functional Status Assessment   Patient has had a recent decline in their functional status and demonstrates the ability to make significant improvements in function in a reasonable and predictable amount of time.     Equipment Recommendations   BSC/3in1;Other (comment) (RW)      Precautions/Restrictions   Precautions Precautions: Fall;Cervical Precaution Booklet Issued: Yes (comment) Recall of Precautions/Restrictions: Intact Required Braces or Orthoses: Cervical Brace Cervical Brace: Soft collar Restrictions Weight Bearing Restrictions Per Provider Order: No     Mobility Bed Mobility Overal bed mobility: Needs Assistance Bed Mobility: Rolling, Sidelying to  Sit, Sit to Sidelying Rolling: Min assist Sidelying to sit: Min assist     Sit to sidelying: Min assist      Transfers Overall transfer level: Needs assistance Equipment used: Rolling walker (2 wheels) Transfers: Sit to/from Stand Sit to Stand: Min assist           General transfer comment: CGA- min A to power up, CGA for static standing      Balance Overall balance assessment: Needs assistance Sitting-balance support: No upper extremity supported, Feet supported Sitting balance-Leahy Scale: Fair     Standing balance support: Single extremity supported, During functional activity Standing balance-Leahy Scale: Poor                             ADL either performed or assessed with clinical judgement   ADL Overall ADL's : Needs assistance/impaired Eating/Feeding: Set up   Grooming: Set up;Sitting   Upper Body Bathing: Set up;Sitting   Lower Body Bathing: Moderate assistance;Sit to/from stand   Upper Body Dressing : Set up;Sitting   Lower Body Dressing: Moderate assistance;Sit to/from stand   Toilet Transfer: Minimal assistance;Ambulation;Rolling walker (2 wheels)   Toileting- Clothing Manipulation and Hygiene: Contact guard assist;Sitting/lateral lean       Functional mobility during ADLs: Minimal assistance;Rolling walker (2 wheels) General ADL Comments: assist for LB ADLs due to weakness, pain, tremors and poor activity tolerance     Vision Baseline Vision/History: 1 Wears glasses Vision Assessment?: No apparent visual deficits     Perception Perception: Not tested       Praxis Praxis: Not tested       Pertinent Vitals/Pain Pain Assessment Pain  Assessment: Faces Faces Pain Scale: Hurts even more Pain Location: neck Pain Descriptors / Indicators: Discomfort, Grimacing, Guarding Pain Intervention(s): Monitored during session, Limited activity within patient's tolerance     Extremity/Trunk Assessment Upper Extremity  Assessment Upper Extremity Assessment: LUE deficits/detail;RUE deficits/detail RUE Deficits / Details: generalized weakness, tremors, pt reports her paraesthesias are worse post-op. ROM at elbow wrist and hands are [facility]. shoulders not assessed due to pain exacerbation LUE Deficits / Details: generalized weakness, tremors, pt reports her paraesthesias are worse post-op. ROM at elbow wrist and hands are [facility]. shoulders not assessed due to pain exacerbation   Lower Extremity Assessment Lower Extremity Assessment: Defer to PT evaluation   Cervical / Trunk Assessment Cervical / Trunk Assessment: Neck Surgery   Communication Communication Communication: Impaired Factors Affecting Communication: Reduced clarity of speech   Cognition Arousal: Alert Behavior During Therapy: Anxious Cognition: No apparent impairments             OT - Cognition Comments: tearful throughout, followed simple commands. needed cues to maintain cervical precautions                 Following commands: Intact       Cueing  General Comments   Cueing Techniques: Verbal cues  VSS on RA   Exercises     Shoulder Instructions      Home Living Family/patient expects to be discharged to:: Private residence Living Arrangements: Spouse/significant other Available Help at Discharge: Family;Available 24 hours/day Type of Home: House (cabin) Home Access: Stairs to enter [employer] of Steps: 2-3   Home Layout: One level     Bathroom Shower/Tub: Walk-in shower (in the main house)   Firefighter: Standard     Home Equipment: Rollator (4 wheels);Cane - single point;Shower seat;Grab bars - tub/shower;Wheelchair - manual   Additional Comments: pt states she recently started staying in a cabin behind her main house, she goes into the main house for showers      Prior Functioning/Environment Prior Level of Function : Needs assist             Mobility Comments: intermittent use  of AD, WC use for long distances ADLs Comments: relatively indep, husband assists intermittently as needed    OT Problem List: Decreased strength;Decreased range of motion;Decreased activity tolerance;Impaired balance (sitting and/or standing);Decreased safety awareness;Decreased knowledge of use of DME or AE;Decreased knowledge of precautions;Pain   OT Treatment/Interventions: Self-care/ADL training;Therapeutic exercise;DME and/or AE instruction;Therapeutic activities;Patient/family education;Balance training      OT Goals(Current goals can be found in the care plan section)   Acute Rehab OT Goals Patient Stated Goal: home OT Goal Formulation: With patient Time For Goal Achievement: 09/22/23 Potential to Achieve Goals: Good ADL Goals Pt Will Perform Grooming: with modified independence Pt Will Perform Lower Body Dressing: with modified independence;sit to/from stand Pt Will Transfer to Toilet: with modified independence;ambulating Pt Will Perform Toileting - Clothing Manipulation and hygiene: with modified independence   OT Frequency:  Min 2X/week    Co-evaluation              AM-PAC OT 6 Clicks Daily Activity     Outcome Measure Help from another person eating meals?: A Little Help from another person taking care of personal grooming?: A Little Help from another person toileting, which includes using toliet, bedpan, or urinal?: A Little Help from another person bathing (including washing, rinsing, drying)?: A Lot Help from another person to put on and taking off regular upper body clothing?: A Little  Help from another person to put on and taking off regular lower body clothing?: A Lot 6 Click Score: 16   End of Session Equipment Utilized During Treatment: Rolling walker (2 wheels) Nurse Communication: Mobility status  Activity Tolerance: Patient limited by pain;Patient tolerated treatment well Patient left: in bed;with call bell/phone within reach;with bed alarm  set  OT Visit Diagnosis: Unsteadiness on feet (R26.81);Other abnormalities of gait and mobility (R26.89);Muscle weakness (generalized) (M62.81);Pain                Time: 9091-9069 OT Time Calculation (min): 22 min Charges:  OT General Charges $OT Visit: 1 Visit OT Evaluation $OT Eval Moderate Complexity: 1 Mod  Lucie Kendall, OTR/L Acute Rehabilitation Services Office [phone] Secure Chat Communication Preferred   Lucie JONETTA Kendall 09/08/2023, 10:13 AM

## 2023-09-08 NOTE — Discharge Summary (Signed)
 Physician Discharge Summary  Patient ID: [name] MRN: [id number] DOB/AGE: October 28, 1955 68 y.o.  Admit date: 09/07/2023 Discharge date: 09/08/2023  Admission Diagnoses:  Discharge Diagnoses:  Principal Problem:   Cervical spondylosis with myelopathy and radiculopathy   Discharged Condition: good  Hospital Course: Patient admitted to the hospital where she underwent uncomplicated  Consults:   Significant Diagnostic Studies:   Treatments:   Discharge Exam: Blood pressure 128/75, pulse 96, temperature 98.8 F (37.1 C), temperature source Oral, resp. rate 16, height 5' 2 (1.575 m), weight 63.7 kg, SpO2 93%. Awake and alert.  Oriented and appropriate.  Motor and sensory function intact.  Wound clean and dry.  Neck soft.  Voice strong.  Chest and abdomen benign.  Disposition: Discharge disposition: 01-Home or Self Care        Allergies as of 09/08/2023       Reactions   Codeine Itching   Effexor [venlafaxine] Other (See Comments)   Unknown reaction   Lyrica  [pregabalin ] Swelling   Pravastatin Other (See Comments)   Myalgias  Myopathy    Topamax  [topiramate ] Other (See Comments)   Unknown reaction   Zestril [lisinopril] Other (See Comments)   Myopathy         Medication List     TAKE these medications    albuterol  108 (90 Base) MCG/ACT inhaler Commonly known as: VENTOLIN  HFA Inhale 2 puffs into the lungs every 6 (six) hours as needed for wheezing or shortness of breath.   ALPRAZolam  1 MG tablet Commonly known as: XANAX  Take 0.5-1 mg by mouth 3 (three) times daily as needed for anxiety.   aspirin 81 MG tablet Take 81 mg by mouth daily.   cyclobenzaprine  10 MG tablet Commonly known as: FLEXERIL  Take 1 tablet (10 mg total) by mouth 3 (three) times daily as needed for muscle spasms.   ESTRACE VAGINAL 0.1 MG/GM vaginal cream Generic drug: estradiol Place 1 application vaginally once a week.   ezetimibe  10 MG tablet Commonly known as: ZETIA  Take 10  mg by mouth at bedtime.   ibuprofen 600 MG tablet Commonly known as: ADVIL Take 600 mg by mouth 2 (two) times daily as needed (pain, headache).   linaclotide  290 MCG Caps capsule Commonly known as: LINZESS  Take 290 mcg by mouth as needed (constipation).   losartan  50 MG tablet Commonly known as: COZAAR  Take 25 mg by mouth at bedtime.   MAGNESIUM  PO Take 1 tablet by mouth daily.   metoCLOPramide  10 MG tablet Commonly known as: REGLAN  Take 10 mg by mouth 2 (two) times daily as needed for vomiting or nausea.   metoprolol  tartrate 25 MG tablet Commonly known as: LOPRESSOR  Take 25 mg by mouth 2 (two) times daily.   nitroGLYCERIN  0.4 MG SL tablet Commonly known as: NITROSTAT  Place 1 tablet (0.4 mg total) under the tongue every 5 (five) minutes as needed for chest pain. What changed: how much to take   Oxycodone  HCl 10 MG Tabs Take 1 tablet (10 mg total) by mouth every 4 (four) hours as needed for severe pain (pain score 7-10). What changed:  when to take this reasons to take this   pantoprazole  40 MG tablet Commonly known as: PROTONIX  Take 40 mg by mouth daily.   POTASSIUM PO Take 1 tablet by mouth daily. OTC   rosuvastatin  10 MG tablet Commonly known as: CRESTOR  Take 10 mg by mouth at bedtime.   VITAMIN B-12 PO Take 1 tablet by mouth daily as needed (fatigue).  Signed: Victory Roach [name] 09/08/2023, 8:08 AM

## 2023-09-08 NOTE — TOC Transition Note (Signed)
 Transition of Care [facility]) - Discharge Note   Patient Details  Name: [name] MRN: [id number] Date of Birth: 10-02-1955  Transition of Care [facility] CM/SW Contact:  Andrez JULIANNA George, RN Phone Number: 09/08/2023, 9:53 AM   Clinical Narrative:     Pt is discharging home with home health services through [facility]. Information on the AVS. [facility] will contact her for the first home visit. Pt has transportation home.   Final next level of care: Home w Home Health Services Barriers to Discharge: No Barriers Identified   Patient Goals and CMS Choice   CMS Medicare.gov Compare Post Acute Care list provided to:: Patient Choice offered to / list presented to : Patient      Discharge Placement                       Discharge Plan and Services Additional resources added to the After Visit Summary for                            [facility] Arranged: PT, OT [facility] Agency: [facility] Date [facility] Agency Contacted: 09/08/23   Representative spoke with at [facility] Agency: Arna  Social Drivers of Health (SDOH) Interventions SDOH Screenings   Social Connections: Unknown (07/30/2022)   Received from [facility]  Tobacco Use: Medium Risk (09/07/2023)     Readmission Risk Interventions     No data to display

## 2023-09-11 NOTE — Anesthesia Postprocedure Evaluation (Signed)
 Anesthesia Post Note  Patient: [name]  Procedure(s) Performed: ANTERIOR CERVICAL DECOMPRESSION/DISCECTOMY FUSION CERVICAL FOUR- CERVICAL FIVE - CERVICAL FIVE-CERVICAL SIX - CERVICAL SIX-CERVICAL SEVEN     Patient location during evaluation: PACU Anesthesia Type: General Level of consciousness: awake and alert Pain management: pain level controlled Vital Signs Assessment: post-procedure vital signs reviewed and stable Respiratory status: spontaneous breathing, nonlabored ventilation and respiratory function stable Cardiovascular status: blood pressure returned to baseline and stable Postop Assessment: no apparent nausea or vomiting Anesthetic complications: no   No notable events documented.                  Candon Caras
